# Patient Record
Sex: Male | Born: 1970 | Hispanic: Yes | Marital: Married | Smoking: Never smoker
Health system: Southern US, Community
[De-identification: ages and names within clinical notes are randomized; demographics above are authoritative.]

## PROBLEM LIST (undated history)

## (undated) DIAGNOSIS — E7800 Pure hypercholesterolemia, unspecified: Secondary | ICD-10-CM

## (undated) DIAGNOSIS — K219 Gastro-esophageal reflux disease without esophagitis: Secondary | ICD-10-CM

---

## 2015-05-18 ENCOUNTER — Encounter (HOSPITAL_BASED_OUTPATIENT_CLINIC_OR_DEPARTMENT_OTHER): Payer: Self-pay

## 2015-05-18 ENCOUNTER — Emergency Department (HOSPITAL_BASED_OUTPATIENT_CLINIC_OR_DEPARTMENT_OTHER)
Admission: EM | Admit: 2015-05-18 | Discharge: 2015-05-18 | Disposition: A | Discharge status: Home / Self Care | Payer: BLUE CROSS/BLUE SHIELD | Attending: Emergency Medicine | Admitting: Emergency Medicine

## 2015-05-18 DIAGNOSIS — Z8639 Personal history of other endocrine, nutritional and metabolic disease: Secondary | ICD-10-CM | POA: Diagnosis not present

## 2015-05-18 DIAGNOSIS — Y998 Other external cause status: Secondary | ICD-10-CM | POA: Insufficient documentation

## 2015-05-18 DIAGNOSIS — X58XXXA Exposure to other specified factors, initial encounter: Secondary | ICD-10-CM | POA: Insufficient documentation

## 2015-05-18 DIAGNOSIS — Y9389 Activity, other specified: Secondary | ICD-10-CM | POA: Diagnosis not present

## 2015-05-18 DIAGNOSIS — Y929 Unspecified place or not applicable: Secondary | ICD-10-CM | POA: Insufficient documentation

## 2015-05-18 DIAGNOSIS — S27818A Other injury of esophagus (thoracic part), initial encounter: Secondary | ICD-10-CM

## 2015-05-18 DIAGNOSIS — T17228A Food in pharynx causing other injury, initial encounter: Secondary | ICD-10-CM | POA: Diagnosis present

## 2015-05-18 DIAGNOSIS — J029 Acute pharyngitis, unspecified: Secondary | ICD-10-CM | POA: Insufficient documentation

## 2015-05-18 DIAGNOSIS — K219 Gastro-esophageal reflux disease without esophagitis: Secondary | ICD-10-CM | POA: Diagnosis not present

## 2015-05-18 HISTORY — DX: Pure hypercholesterolemia, unspecified: E78.00

## 2015-05-18 HISTORY — DX: Gastro-esophageal reflux disease without esophagitis: K21.9

## 2015-05-18 MED ORDER — GI COCKTAIL ~~LOC~~
30.0000 mL | Freq: Once | ORAL | Status: AC
Start: 2015-05-18 — End: 2015-05-18
  Administered 2015-05-18: 30 mL via ORAL
  Filled 2015-05-18: qty 30

## 2015-05-18 NOTE — ED Notes (Signed)
Fluid challenge w/o difficulty no vomiting after drinking water

## 2015-05-18 NOTE — ED Notes (Signed)
Pt states feel like fruit stuck in throat since 1pm-NAD

## 2015-05-18 NOTE — ED Provider Notes (Signed)
CSN: [id number]     Arrival date & time 05/18/15  1330 History   First MD Initiated Contact with Patient 05/18/15 1332     Chief Complaint  Patient presents with  . ?FB esophagus      (Consider location/radiation/quality/duration/timing/severity/associated sxs/prior Treatment) HPI Comments: Pt. Is a 44 y/o M who says that he woke up this morning with a sore / dry throat. He says that he had watermelon and cantaloupe. He says that he felt like a piece of watermelon was stuck in his throat. He says that he swallowed plenty of water, and continued to eat his fruit but the sensation of something stuck in his throat persisted. He says he continued to drink fluids, and [facility] he got a cold beverage and drank that as well. He says it was somewhat better after this. He tried to make himself throw up in order to relive what he thought was an obstruction but was unsuccessful. He otherwise has had not bloody sputum, no difficulty swallowing, the pain / discomfort is improving. He has no airways compromise and does not feel short of breath. He has no nausea or vomiting. He says that he will follow up with his PCP.    The history is provided by the patient.    Past Medical History  Diagnosis Date  . GERD (gastroesophageal reflux disease)   . High cholesterol    History reviewed. No pertinent past surgical history. No family history on file. History  Substance Use Topics  . Smoking status: Never Smoker   . Smokeless tobacco: Not on file  . Alcohol Use: No    Review of Systems  Constitutional: Negative.  Negative for fever, chills, activity change, appetite change and fatigue.  HENT: Positive for sore throat. Negative for congestion, drooling, facial swelling, mouth sores, postnasal drip, rhinorrhea, tinnitus, trouble swallowing and voice change.   Eyes: Negative.  Negative for pain and redness.  Respiratory: Negative.  Negative for cough, choking, chest tightness, shortness  of breath and wheezing.   Cardiovascular: Negative.  Negative for chest pain.  Gastrointestinal: Negative for nausea, vomiting, abdominal pain, constipation and abdominal distention.  Endocrine: Negative.   Genitourinary: Negative.   Musculoskeletal: Negative.  Negative for myalgias and neck stiffness.  Skin: Negative.   Allergic/Immunologic: Negative.  Negative for food allergies.  Neurological: Negative.   Hematological: Negative.   Psychiatric/Behavioral: Negative.       Allergies  Review of patient's allergies indicates no known allergies.  Home Medications   Prior to Admission medications   Medication Sig Start Date End Date Taking? Authorizing Provider  OMEPRAZOLE PO Take by mouth.   Yes Historical Provider, MD  UNKNOWN TO PATIENT Cholesterol med   Yes Historical Provider, MD   BP 142/88 mmHg  Pulse 84  Temp(Src) 98.2 F (36.8 C) (Oral)  Resp 16  Ht 5\' 7"  (1.702 m)  Wt 257 lb (116.574 kg)  BMI 40.24 kg/m2  SpO2 98% Physical Exam  Constitutional: He is oriented to person, place, and time. He appears well-developed and well-nourished. No distress.  HENT:  Head: Normocephalic and atraumatic.  Nose: Nose normal.  Mouth/Throat: Uvula is midline, oropharynx is clear and moist and mucous membranes are normal. No uvula swelling. No oropharyngeal exudate, posterior oropharyngeal edema, posterior oropharyngeal erythema or tonsillar abscesses.  Eyes: Conjunctivae and EOM are normal. Pupils are equal, round, and reactive to light.  Neck: Normal range of motion. Neck supple.  Cardiovascular: Normal rate, regular rhythm, normal heart sounds  and intact distal pulses.  Exam reveals no gallop and no friction rub.   No murmur heard. Pulmonary/Chest: Effort normal and breath sounds normal. No stridor. No respiratory distress. He has no wheezes. He has no rales.  Abdominal: Soft. Bowel sounds are normal. He exhibits no distension. There is no tenderness. There is no rebound and no  guarding.  Musculoskeletal: Normal range of motion. He exhibits no edema or tenderness.  Lymphadenopathy:    He has no cervical adenopathy.  Neurological: He is alert and oriented to person, place, and time.  Skin: Skin is warm and dry. He is not diaphoretic.  Psychiatric: He has a normal mood and affect.    ED Course  Procedures (including critical care time) Labs Review Labs Reviewed - No data to display  Imaging Review No results found.   EKG Interpretation None      MDM   Final diagnoses:  None    Pt. Is a 44 y/o M here with discomfort in his throat after a hard swallow of fruit this am. Given GI cocktail to improve discomfort. No evidence of foreign body to exam. He feels as though it is better overall. He will follow up with PCP. Safe for discharge to home.     Yolande Jolly, MD 05/18/15 1441  Tilden Fossa, MD 05/18/15 1505

## 2015-05-18 NOTE — Discharge Instructions (Signed)
Swallowed Foreign Body, Adult °You have swallowed an object (foreign body). Once the foreign body has passed through the food tube (esophagus), which leads from the mouth to the stomach, it will usually continue through the body without problems. This is because the point where the esophagus enters into the stomach is the narrowest place through which the foreign body must pass. °Sometimes the foreign body gets stuck. The most common type of foreign body obstruction in adults is food impaction. Many times, bones from fish or meat products may become lodged in the esophagus or injure the throat on the way down. When there is an object that obstructs the esophagus, the most obvious symptoms are pain and the inability to swallow normally. In some cases, foreign bodies that can be life threatening are swallowed. Examples of these are certain medications and illicit drugs. Often in these instances, patients are afraid of telling what they swallowed. However, it is extremely important to tell the emergency caregiver what was swallowed because life-saving treatment may be needed.  °X-ray exams may be taken to find the location of the foreign body. However, some objects do not show up well or may be too small to be seen on an X-ray image. If the foreign body is too large or too sharp, it may be too dangerous to allow it to pass on its own. You may need to see a caregiver who specializes in the digestive system (gastroenterologist). In a few cases, a specialist may need to remove the object using a method called "endoscopy".  This involves passing a thin, soft, flexible tube into the food pipe to locate and remove the object. Follow up with your primary doctor or the referral you were given by the emergency caregiver. °HOME CARE INSTRUCTIONS  °· If your caregiver says it is safe for you to eat, then only have liquids and soft foods until your symptoms improve. °· Once you are eating normally: °¨ Cut food into small  pieces. °¨ Remove small bones from food. °¨ Remove large seeds and pits from fruit. °¨ Chew your food well. °¨ Do not talk, laugh, or engage in physical activity while eating or swallowing. °SEEK MEDICAL CARE IF: °· You develop worsening shortness of breath, uncontrollable coughing, chest pains or high fever, greater than 102° F (38.9° C). °· You are unable to eat or drink or you feel that food is getting stuck in your throat. °· You have choking symptoms or cannot stop drooling. °· You develop abdominal pain, vomiting (especially of blood), or rectal bleeding. °MAKE SURE YOU:  °· Understand these instructions. °· Will watch your condition. °· Will get help right away if you are not doing well or get worse. °Document Released: 04/11/2010 Document Revised: 01/14/2012 Document Reviewed: 04/11/2010 °ExitCare® Patient Information ©2015 [employer]. This information is not intended to replace advice given to you by your health care provider. Make sure you discuss any questions you have with your health care provider. ° °

## 2015-05-20 ENCOUNTER — Encounter (HOSPITAL_BASED_OUTPATIENT_CLINIC_OR_DEPARTMENT_OTHER): Payer: Self-pay | Admitting: *Deleted

## 2015-05-20 ENCOUNTER — Emergency Department (HOSPITAL_BASED_OUTPATIENT_CLINIC_OR_DEPARTMENT_OTHER)
Admission: EM | Admit: 2015-05-20 | Discharge: 2015-05-20 | Disposition: A | Discharge status: Home / Self Care | Payer: BLUE CROSS/BLUE SHIELD | Attending: Emergency Medicine | Admitting: Emergency Medicine

## 2015-05-20 DIAGNOSIS — R1310 Dysphagia, unspecified: Secondary | ICD-10-CM

## 2015-05-20 DIAGNOSIS — E780 Pure hypercholesterolemia: Secondary | ICD-10-CM | POA: Diagnosis not present

## 2015-05-20 DIAGNOSIS — K219 Gastro-esophageal reflux disease without esophagitis: Secondary | ICD-10-CM | POA: Diagnosis not present

## 2015-05-20 DIAGNOSIS — K222 Esophageal obstruction: Secondary | ICD-10-CM | POA: Diagnosis present

## 2015-05-20 MED ORDER — GI COCKTAIL ~~LOC~~
30.0000 mL | Freq: Once | ORAL | Status: AC
  Administered 2015-05-20: 30 mL via ORAL
  Filled 2015-05-20: qty 30

## 2015-05-20 NOTE — ED Notes (Signed)
Pt denies any current nausea at this time

## 2015-05-20 NOTE — ED Notes (Signed)
MD at bedside. 

## 2015-05-20 NOTE — ED Notes (Signed)
States he was here to days ago with food stuck in his esophagus. This am a pill got stuck. It dissolved over time. He is able to drink water.

## 2015-05-20 NOTE — ED Provider Notes (Signed)
CSN: [id number]     Arrival date & time 05/20/15  1530 History   First MD Initiated Contact with Patient 05/20/15 1624     Chief Complaint  Patient presents with  . Esophageal Stricture      (Consider location/radiation/quality/duration/timing/severity/associated sxs/prior Treatment) HPI Comments: Patient is a 44 year old male who presents for evaluation of esophageal irritation. He states that he had fruit stuck in his throat 2 days ago, however this spontaneously resolved. He was given a GI cocktail here and felt better. Today he attempted to take a naproxen which she believes got stuck in his throat. He was able to drink water and dissolve the pill and is now feeling better. He is concerned of why this is happened twice. He was seen this morning at urgent care and they will plan to set up follow-up with GI. Patient presents here with the impression that more needs to be done. He is able to drink liquids without difficulty.  The history is provided by the patient.    Past Medical History  Diagnosis Date  . GERD (gastroesophageal reflux disease)   . High cholesterol    History reviewed. No pertinent past surgical history. No family history on file. History  Substance Use Topics  . Smoking status: Never Smoker   . Smokeless tobacco: Not on file  . Alcohol Use: No    Review of Systems  All other systems reviewed and are negative.     Allergies  Review of patient's allergies indicates no known allergies.  Home Medications   Prior to Admission medications   Medication Sig Start Date End Date Taking? Authorizing Provider  OMEPRAZOLE PO Take by mouth.    Historical Provider, MD  UNKNOWN TO PATIENT Cholesterol med    Historical Provider, MD   BP 152/92 mmHg  Pulse 64  Temp(Src) 98.2 F (36.8 C) (Oral)  Resp 20  Ht 5\' 7"  (1.702 m)  Wt 257 lb (116.574 kg)  BMI 40.24 kg/m2  SpO2 97% Physical Exam  Constitutional: He is oriented to person, place, and time. He appears  well-developed and well-nourished. No distress.  HENT:  Head: Normocephalic and atraumatic.  Mouth/Throat: Oropharynx is clear and moist.  Neck: Normal range of motion. Neck supple.  Cardiovascular: Normal rate, regular rhythm and normal heart sounds.   No murmur heard. Pulmonary/Chest: Effort normal and breath sounds normal. No respiratory distress. He has no wheezes.  Abdominal: Soft. Bowel sounds are normal. He exhibits no distension. There is no tenderness.  Musculoskeletal: Normal range of motion. He exhibits no edema.  Lymphadenopathy:    He has no cervical adenopathy.  Neurological: He is alert and oriented to person, place, and time.  Skin: Skin is warm and dry. He is not diaphoretic.  Nursing note and vitals reviewed.   ED Course  Procedures (including critical care time) Labs Review Labs Reviewed - No data to display  Imaging Review No results found.   EKG Interpretation None      MDM   Final diagnoses:  None    Patient is able to tolerate liquids without difficulty. He is requesting a GI cocktail because this gave him relief 2 days ago. This will be given and he will be given the follow-up information for gastroenterology with whom he can schedule an outpatient follow-up to discuss an endoscopy.    Geoffery Lyonsouglas Alicja Everitt, MD 05/20/15 [id number]

## 2015-05-20 NOTE — ED Notes (Signed)
Pt presents to ED, returned after being here approx 2 days ago, recd GI cocktail, returns today with having difficulty swallowing. Has been tolerating crackers and soup. This am attempted to take PO med and medication "got stuck" per pt statement.

## 2015-05-20 NOTE — Discharge Instructions (Signed)
Call the [facility] office to arrange a follow-up appointment. The contact information has been provided in this discharge summary. Call on Monday to arrange this appointment.  Return to the emergency department if you develop difficulty breathing, and inability to swallow, or other new and concerning symptoms.   Dysphagia Swallowing problems (dysphagia) occur when solids and liquids seem to stick in your throat on the way down to your stomach, or the food takes longer to get to the stomach. Other symptoms include regurgitating food, noises coming from the throat, chest discomfort with swallowing, and a feeling of fullness or the feeling of something being stuck in your throat when swallowing. When blockage in your throat is complete, it may be associated with drooling. CAUSES  Problems with swallowing may occur because of problems with the muscles. The food cannot be propelled in the usual manner into your stomach. You may have ulcers, scar tissue, or inflammation in the tube down which food travels from your mouth to your stomach (esophagus), which blocks food from passing normally into the stomach. Causes of inflammation include:  Acid reflux from your stomach into your esophagus.  Infection.  Radiation treatment for cancer.  Medicines taken without enough fluids to wash them down into your stomach. You may have nerve problems that prevent signals from being sent to the muscles of your esophagus to contract and move your food down to your stomach. Globus pharyngeus is a relatively common problem in which there is a sense of an obstruction or difficulty in swallowing, without any physical abnormalities of the swallowing passages being found. This problem usually improves over time with reassurance and testing to rule out other causes. DIAGNOSIS Dysphagia can be diagnosed and its cause can be determined by tests in which you swallow a white substance that helps illuminate the inside  of your throat (contrast medium) while X-rays are taken. Sometimes a flexible telescope that is inserted down your throat (endoscopy) to look at your esophagus and stomach is used. TREATMENT   If the dysphagia is caused by acid reflux or infection, medicines may be used.  If the dysphagia is caused by problems with your swallowing muscles, swallowing therapy may be used to help you strengthen your swallowing muscles.  If the dysphagia is caused by a blockage or mass, procedures to remove the blockage may be done. HOME CARE INSTRUCTIONS  Try to eat soft food that is easier to swallow and check your weight on a daily basis to be sure that it is not decreasing.  Be sure to drink liquids when sitting upright (not lying down). SEEK MEDICAL CARE IF:  You are losing weight because you are unable to swallow.  You are coughing when you drink liquids (aspiration).  You are coughing up partially digested food. SEEK IMMEDIATE MEDICAL CARE IF:  You are unable to swallow your own saliva .  You are having shortness of breath or a fever, or both.  You have a hoarse voice along with difficulty swallowing. MAKE SURE YOU:  Understand these instructions.  Will watch your condition.  Will get help right away if you are not doing well or get worse. Document Released: 10/19/2000 Document Revised: 03/08/2014 Document Reviewed: 04/10/2013 [facility] Patient Information 2015 [place], [place]. This information is not intended to replace advice given to you by your health care provider. Make sure you discuss any questions you have with your health care provider.

## 2015-05-24 ENCOUNTER — Emergency Department (HOSPITAL_COMMUNITY): Payer: BLUE CROSS/BLUE SHIELD

## 2015-05-24 ENCOUNTER — Emergency Department (HOSPITAL_COMMUNITY)
Admission: EM | Admit: 2015-05-24 | Discharge: 2015-05-24 | Disposition: A | Discharge status: Home / Self Care | Payer: BLUE CROSS/BLUE SHIELD | Attending: Emergency Medicine | Admitting: Emergency Medicine

## 2015-05-24 ENCOUNTER — Encounter (HOSPITAL_COMMUNITY): Payer: Self-pay | Admitting: *Deleted

## 2015-05-24 DIAGNOSIS — F458 Other somatoform disorders: Secondary | ICD-10-CM | POA: Diagnosis not present

## 2015-05-24 DIAGNOSIS — E780 Pure hypercholesterolemia: Secondary | ICD-10-CM | POA: Insufficient documentation

## 2015-05-24 DIAGNOSIS — K219 Gastro-esophageal reflux disease without esophagitis: Secondary | ICD-10-CM | POA: Insufficient documentation

## 2015-05-24 DIAGNOSIS — R0989 Other specified symptoms and signs involving the circulatory and respiratory systems: Secondary | ICD-10-CM

## 2015-05-24 DIAGNOSIS — M549 Dorsalgia, unspecified: Secondary | ICD-10-CM | POA: Diagnosis not present

## 2015-05-24 DIAGNOSIS — F419 Anxiety disorder, unspecified: Secondary | ICD-10-CM | POA: Diagnosis not present

## 2015-05-24 DIAGNOSIS — R0981 Nasal congestion: Secondary | ICD-10-CM | POA: Insufficient documentation

## 2015-05-24 DIAGNOSIS — Z79899 Other long term (current) drug therapy: Secondary | ICD-10-CM | POA: Diagnosis not present

## 2015-05-24 DIAGNOSIS — R0602 Shortness of breath: Secondary | ICD-10-CM | POA: Diagnosis present

## 2015-05-24 MED ORDER — SALINE SPRAY 0.65 % NA SOLN
1.0000 | Freq: Once | NASAL | Status: AC
  Administered 2015-05-24: 1 via NASAL
  Filled 2015-05-24: qty 44

## 2015-05-24 MED ORDER — LORAZEPAM 1 MG PO TABS: 1.0000 mg | ORAL_TABLET | Freq: Three times a day (TID) | ORAL | Status: DC | PRN

## 2015-05-24 NOTE — ED Notes (Signed)
Pt given ice water per Dr. Norlene Campbelltter

## 2015-05-24 NOTE — ED Provider Notes (Signed)
CSN: [id number]     Arrival date & time 05/24/15  0027 History  This chart was scribed for Marisa Severin, MD by Placido Sou, ED scribe. This patient was seen in room WA13/WA13 and the patient's care was started at 2:40 AM.  Chief Complaint  Patient presents with  . Shortness of Breath   The history is provided by the patient. No language interpreter was used.    HPI Comments: Casey Sims is a 44 y.o. male, with a history of GERD, who presents to the Emergency Department complaining of acute SOB with onset at 12:30 pm. He notes associated trouble swallowing, describes the feeling as "dry", and further notes using his albuterol inhaler with no relief of his symptoms. Pt describes the SOB as a "blockage" although he confirms never experiencing an inability to breathe. Pt notes a history of trouble swallowing with one occurrence involving fruit and another involving a pill. He notes receiving a referral for a gastroenterologist after a prior visit and is working to make an appointment with them.    Pt also notes left-sided back pain with onset 2 weeks ago. He notes seeing a previous provider at urgent care for this issue and received an x-ray with negative results as well as an anti-inflammatory Rx.   Past Medical History  Diagnosis Date  . GERD (gastroesophageal reflux disease)   . High cholesterol    History reviewed. No pertinent past surgical history. No family history on file. History  Substance Use Topics  . Smoking status: Never Smoker   . Smokeless tobacco: Not on file  . Alcohol Use: No    Review of Systems  HENT: Positive for congestion, trouble swallowing and voice change.   Respiratory: Positive for shortness of breath.   Musculoskeletal: Positive for back pain.  All other systems reviewed and are negative.   Allergies  Review of patient's allergies indicates no known allergies.  Home Medications   Prior to Admission medications   Medication Sig Start Date End Date  Taking? Authorizing Provider  albuterol (PROVENTIL HFA;VENTOLIN HFA) 108 (90 BASE) MCG/ACT inhaler Inhale 1 puff into the lungs every 6 (six) hours as needed for wheezing or shortness of breath.   Yes Historical Provider, MD  atorvastatin (LIPITOR) 20 MG tablet Take 20 mg by mouth daily.   Yes Historical Provider, MD  baclofen (LIORESAL) 10 MG tablet Take 10 mg by mouth 3 (three) times daily.   Yes Historical Provider, MD  omeprazole (PRILOSEC) 20 MG capsule Take 20 mg by mouth daily.   Yes Historical Provider, MD  OMEPRAZOLE PO Take by mouth.    Historical Provider, MD  UNKNOWN TO PATIENT Cholesterol med    Historical Provider, MD   BP 158/100 mmHg  Pulse 86  Temp(Src) 98.8 F (37.1 C) (Oral)  Resp 17  SpO2 100% Physical Exam  Constitutional: He is oriented to person, place, and time. He appears well-developed and well-nourished.  HENT:  Head: Normocephalic and atraumatic.  Nose: Nose normal.  Mouth/Throat: Oropharynx is clear and moist.  Eyes: Conjunctivae and EOM are normal. Pupils are equal, round, and reactive to light.  Neck: Normal range of motion. Neck supple. No JVD present. No tracheal deviation present. No thyromegaly present.  No stridor, no masses, swallowing and breathing easily  Cardiovascular: Normal rate, regular rhythm, normal heart sounds and intact distal pulses.  Exam reveals no gallop and no friction rub.   No murmur heard. Pulmonary/Chest: Effort normal and breath sounds normal. No stridor. No respiratory distress.  He has no wheezes. He has no rales. He exhibits no tenderness.  Abdominal: Soft. Bowel sounds are normal. He exhibits no distension and no mass. There is no tenderness. There is no rebound and no guarding.  Musculoskeletal: Normal range of motion. He exhibits tenderness (tenderness to palpation just medial to left scapula without overlying skin changes, crepitus or masses). He exhibits no edema.  Lymphadenopathy:    He has no cervical adenopathy.   Neurological: He is alert and oriented to person, place, and time. He displays normal reflexes. He exhibits normal muscle tone. Coordination normal.  Skin: Skin is warm and dry. No rash noted. No erythema. No pallor.  Psychiatric: His behavior is normal. Judgment and thought content normal.  Anxious appearing  Nursing note and vitals reviewed.   ED Course  Procedures  DIAGNOSTIC STUDIES: Oxygen Saturation is 100% on RA, normal by my interpretation.    COORDINATION OF CARE: 2:49 AM Discussed treatment plan with pt at bedside and pt agreed to plan.  Labs Review Labs Reviewed - No data to display  Imaging Review Dg Neck Soft Tissue  05/24/2015   CLINICAL DATA:  Shortness of breath, throat tightness, difficulty swallowing.  EXAM: NECK SOFT TISSUES - 1+ VIEW  COMPARISON:  None.  FINDINGS: There is no evidence of retropharyngeal soft tissue swelling or epiglottic enlargement. Punctate probable tonsillolith. The cervical airway is unremarkable and no radio-opaque foreign body identified.  Fullness of the submandibular/floor of mouth soft tissues.  IMPRESSION: Fullness of the submandibular/floor of mouth soft tissues could reflect edema or habitus.  Patent airway.   Electronically Signed   By: Awilda Metroourtnay  Bloomer M.D.   On: 05/24/2015 03:30   Dg Chest 2 View  05/24/2015   CLINICAL DATA:  Acute onset of shortness of breath. Throat tightness and difficulty swallowing. Initial encounter.  EXAM: CHEST  2 VIEW  COMPARISON:  None.  FINDINGS: The lungs are well-aerated and clear. There is no evidence of focal opacification, pleural effusion or pneumothorax.  The heart is normal in size; the mediastinal contour is within normal limits. No acute osseous abnormalities are seen.  IMPRESSION: No acute cardiopulmonary process seen.   Electronically Signed   By: Roanna RaiderJeffery  Chang M.D.   On: 05/24/2015 02:03     EKG Interpretation   Date/Time:  Tuesday May 24 2015 00:49:39 EDT Ventricular Rate:  81 PR  Interval:  176 QRS Duration: 104 QT Interval:  367 QTC Calculation: 426 R Axis:   52 Text Interpretation:  Sinus rhythm Confirmed by Kyree Fedorko  MD, Casey Sims ([id number]) on  05/24/2015 3:34:08 AM      MDM   Final diagnoses:  Globus sensation    44 year old male with a sensation of his esophagus/throat closing while at work.  Patient has had ongoing issues with left upper back pain, and dysphagia.  He is awaiting follow-up with GI.  Patient reports that his mouth is currently dry and he has to continually swallow to get past the lump in his throat.  Workup otherwise unremarkable, suspect globus sensation.  Patient isn't encouraged to follow back up with his primary care doctor as planned with referral to GI.  Will prescribe short course of Ativan to help with anxiety.   I personally performed the services described in this documentation, which was scribed in my presence. The recorded information has been reviewed and is accurate.     Marisa Severinlga Jamonta Goerner, MD 05/24/15 [phone]

## 2015-05-24 NOTE — Discharge Instructions (Signed)
Continue current medications.  Take ativan as needed for anxiety.   Globus Syndrome Globus Syndrome is a feeling of a lump or a sensation of something caught in your throat. Eating food or drinking fluids does not seem to get rid of it. Yet it is not noticeable during the actual act of swallowing food or liquids. Usually there is nothing physically wrong. It is troublesome because it is an unpleasant sensation which is sometimes difficult to ignore and at times may seem to worsen. The syndrome is quite common. It is estimated 45% of the population experiences features of the condition at some stage during their lives. The symptoms are usually temporary. The largest group of people who feel the need to seek medical treatment is females between the ages of 28 to 45.  CAUSES  Globus Syndrome appears to be triggered by or aggravated by stress, anxiety and depression.  Tension related to stress could product abnormal muscle spasms in the esophagus which would account for the sensation of a lump or ball in your throat.  Frequent swallowing or drying of the throat caused by anxiety or other strong emotions can also produce this uncomfortable sensation in your throat.  Fear and sadness can be expressed by the body in many ways. For instance, if you had a relative with throat cancer you might become overly concerned about your own health and develop uncomfortable sensations in your throat.  The reaction to a crisis or a trauma event in your life can take the form of a lump in your throat. It is as if you are indirectly saying you can not handle or "swallow" one more thing. DIAGNOSIS  Usually your caregiver will know what is wrong by talking to you and examining you. If the condition persists for several days, more testing may be done to make sure there is not another problem present. This is usually not the case. TREATMENT   Reassurance is often the best treatment available. Usually the problem leaves  without treatment over several days.  Sometimes anti-anxiety medications may be prescribed.  Counseling or talk therapy can also help with strong underlying emotions.  Note that in most cases this is not something that keeps coming back and you should not be concerned or worried. Document Released: 01/12/2004 Document Revised: 01/14/2012 Document Reviewed: 06/10/2008 [facility] Patient Information 2015 [place], [place]. This information is not intended to replace advice given to you by your health care provider. Make sure you discuss any questions you have with your health care provider.   Dysphagia Swallowing problems (dysphagia) occur when solids and liquids seem to stick in your throat on the way down to your stomach, or the food takes longer to get to the stomach. Other symptoms include regurgitating food, noises coming from the throat, chest discomfort with swallowing, and a feeling of fullness or the feeling of something being stuck in your throat when swallowing. When blockage in your throat is complete, it may be associated with drooling. CAUSES  Problems with swallowing may occur because of problems with the muscles. The food cannot be propelled in the usual manner into your stomach. You may have ulcers, scar tissue, or inflammation in the tube down which food travels from your mouth to your stomach (esophagus), which blocks food from passing normally into the stomach. Causes of inflammation include:  Acid reflux from your stomach into your esophagus.  Infection.  Radiation treatment for cancer.  Medicines taken without enough fluids to wash them down into your stomach. You  may have nerve problems that prevent signals from being sent to the muscles of your esophagus to contract and move your food down to your stomach. Globus pharyngeus is a relatively common problem in which there is a sense of an obstruction or difficulty in swallowing, without any physical abnormalities of the  swallowing passages being found. This problem usually improves over time with reassurance and testing to rule out other causes. DIAGNOSIS Dysphagia can be diagnosed and its cause can be determined by tests in which you swallow a white substance that helps illuminate the inside of your throat (contrast medium) while X-rays are taken. Sometimes a flexible telescope that is inserted down your throat (endoscopy) to look at your esophagus and stomach is used. TREATMENT   If the dysphagia is caused by acid reflux or infection, medicines may be used.  If the dysphagia is caused by problems with your swallowing muscles, swallowing therapy may be used to help you strengthen your swallowing muscles.  If the dysphagia is caused by a blockage or mass, procedures to remove the blockage may be done. HOME CARE INSTRUCTIONS  Try to eat soft food that is easier to swallow and check your weight on a daily basis to be sure that it is not decreasing.  Be sure to drink liquids when sitting upright (not lying down). SEEK MEDICAL CARE IF:  You are losing weight because you are unable to swallow.  You are coughing when you drink liquids (aspiration).  You are coughing up partially digested food. SEEK IMMEDIATE MEDICAL CARE IF:  You are unable to swallow your own saliva .  You are having shortness of breath or a fever, or both.  You have a hoarse voice along with difficulty swallowing. MAKE SURE YOU:  Understand these instructions.  Will watch your condition.  Will get help right away if you are not doing well or get worse. Document Released: 10/19/2000 Document Revised: 03/08/2014 Document Reviewed: 04/10/2013 [facility] Patient Information 2015 [place]. This information is not intended to replace advice given to you by your health care provider. Make sure you discuss any questions you have with your health care provider.

## 2015-05-24 NOTE — ED Notes (Addendum)
Pt reports SOB while he was at work [employer]. Pt also c/o back pain and dysphagia.  Pt reports he was [facility] SO[facility]

## 2015-05-25 ENCOUNTER — Emergency Department (HOSPITAL_BASED_OUTPATIENT_CLINIC_OR_DEPARTMENT_OTHER)
Admission: EM | Admit: 2015-05-25 | Discharge: 2015-05-25 | Disposition: A | Discharge status: Home / Self Care | Payer: BLUE CROSS/BLUE SHIELD | Attending: Emergency Medicine | Admitting: Emergency Medicine

## 2015-05-25 ENCOUNTER — Encounter (HOSPITAL_BASED_OUTPATIENT_CLINIC_OR_DEPARTMENT_OTHER): Payer: Self-pay | Admitting: *Deleted

## 2015-05-25 ENCOUNTER — Ambulatory Visit: Payer: Self-pay | Admitting: Medical

## 2015-05-25 ENCOUNTER — Emergency Department (HOSPITAL_BASED_OUTPATIENT_CLINIC_OR_DEPARTMENT_OTHER): Payer: BLUE CROSS/BLUE SHIELD

## 2015-05-25 DIAGNOSIS — Z8619 Personal history of other infectious and parasitic diseases: Secondary | ICD-10-CM | POA: Diagnosis not present

## 2015-05-25 DIAGNOSIS — R1310 Dysphagia, unspecified: Secondary | ICD-10-CM | POA: Diagnosis not present

## 2015-05-25 DIAGNOSIS — E780 Pure hypercholesterolemia: Secondary | ICD-10-CM | POA: Insufficient documentation

## 2015-05-25 DIAGNOSIS — Z79899 Other long term (current) drug therapy: Secondary | ICD-10-CM | POA: Insufficient documentation

## 2015-05-25 DIAGNOSIS — R070 Pain in throat: Secondary | ICD-10-CM | POA: Diagnosis not present

## 2015-05-25 DIAGNOSIS — K219 Gastro-esophageal reflux disease without esophagitis: Secondary | ICD-10-CM | POA: Insufficient documentation

## 2015-05-25 DIAGNOSIS — J029 Acute pharyngitis, unspecified: Secondary | ICD-10-CM | POA: Diagnosis present

## 2015-05-25 DIAGNOSIS — R52 Pain, unspecified: Secondary | ICD-10-CM

## 2015-05-25 LAB — RAPID STREP SCREEN (MED CTR MEBANE ONLY): Streptococcus, Group A Screen (Direct): NEGATIVE

## 2015-05-25 MED ORDER — IOHEXOL 300 MG/ML  SOLN
75.0000 mL | Freq: Once | INTRAMUSCULAR | Status: AC | PRN
  Administered 2015-05-25: 75 mL via INTRAVENOUS

## 2015-05-25 MED ORDER — GI COCKTAIL ~~LOC~~
30.0000 mL | Freq: Once | ORAL | Status: AC
  Administered 2015-05-25: 30 mL via ORAL
  Filled 2015-05-25: qty 30

## 2015-05-25 NOTE — ED Provider Notes (Addendum)
CSN: [id number]     Arrival date & time 05/25/15  1946 History   First MD Initiated Contact with Patient 05/25/15 2013     Chief Complaint  Patient presents with  . Sore Throat     (Consider location/radiation/quality/duration/timing/severity/associated sxs/prior Treatment) HPI comPlains of pain with swallowing and feeling that there is a mass in his throat, left side onset 5 days ago. He denies difficulty breathing no felt difficulty breathing on her 05/20/15. He no longer feels as anything is stuck in his throat. He has pain with swallowing his saliva. He is afraid to eat solid food he has been drinking, without difficulty. He's been prescribed Ativan which she's taken without relief and has an appointment with 8 gastroenterologist tomorrow. Past Medical History  Diagnosis Date  . GERD (gastroesophageal reflux disease)   . High cholesterol    H pylori History reviewed. No pertinent past surgical history. History reviewed. No pertinent family history. History  Substance Use Topics  . Smoking status: Never Smoker   . Smokeless tobacco: Not on file  . Alcohol Use: No   no illicit drug use  Review of Systems  Constitutional: Negative.   HENT: Positive for sore throat and trouble swallowing. Negative for voice change.   Respiratory: Negative.   Cardiovascular: Negative.   Gastrointestinal: Negative.   Musculoskeletal: Negative.   Skin: Negative.   Neurological: Negative.   Psychiatric/Behavioral: Negative.   All other systems reviewed and are negative.     Allergies  Review of patient's allergies indicates no known allergies.  Home Medications   Prior to Admission medications   Medication Sig Start Date End Date Taking? Authorizing Provider  albuterol (PROVENTIL HFA;VENTOLIN HFA) 108 (90 BASE) MCG/ACT inhaler Inhale 1 puff into the lungs every 6 (six) hours as needed for wheezing or shortness of breath.    Historical Provider, MD  atorvastatin (LIPITOR) 20 MG tablet Take  20 mg by mouth daily.    Historical Provider, MD  baclofen (LIORESAL) 10 MG tablet Take 10 mg by mouth 3 (three) times daily.    Historical Provider, MD  LORazepam (ATIVAN) 1 MG tablet Take 1 tablet (1 mg total) by mouth 3 (three) times daily as needed for anxiety. 05/24/15   Marisa Severinlga Otter, MD  omeprazole (PRILOSEC) 20 MG capsule Take 20 mg by mouth daily.    Historical Provider, MD  OMEPRAZOLE PO Take by mouth.    Historical Provider, MD  UNKNOWN TO PATIENT Cholesterol med    Historical Provider, MD   BP 131/91 mmHg  Pulse 73  Temp(Src) 99.1 F (37.3 C)  Resp 16  Ht 5\' 7"  (1.702 m)  Wt 245 lb (111.131 kg)  BMI 38.36 kg/m2  SpO2 100% Physical Exam  Constitutional: He appears well-developed and well-nourished. No distress.  HENT:  Head: Normocephalic and atraumatic.  Mouth/Throat: Oropharynx is clear and moist.  No trismus. Submandibular area nontender or firm  Eyes: Conjunctivae are normal. Pupils are equal, round, and reactive to light.  Neck: Neck supple. No tracheal deviation present. No thyromegaly present.  Cardiovascular: Normal rate and regular rhythm.   No murmur heard. Pulmonary/Chest: Effort normal and breath sounds normal.  Abdominal: Soft. Bowel sounds are normal. He exhibits no distension. There is no tenderness.  Musculoskeletal: Normal range of motion. He exhibits no edema or tenderness.  Lymphadenopathy:    He has no cervical adenopathy.  Neurological: He is alert. Coordination normal.  Skin: Skin is warm and dry. No rash noted.  Psychiatric: He has a normal  mood and affect.  Nursing note and vitals reviewed.   ED Course  Procedures (including critical care time) Labs Review Labs Reviewed  RAPID STREP SCREEN (NOT AT [facility])  CULTURE, GROUP A STREP    Imaging Review Dg Neck Soft Tissue  05/24/2015   CLINICAL DATA:  Shortness of breath, throat tightness, difficulty swallowing.  EXAM: NECK SOFT TISSUES - 1+ VIEW  COMPARISON:  None.  FINDINGS: There is no evidence  of retropharyngeal soft tissue swelling or epiglottic enlargement. Punctate probable tonsillolith. The cervical airway is unremarkable and no radio-opaque foreign body identified.  Fullness of the submandibular/floor of mouth soft tissues.  IMPRESSION: Fullness of the submandibular/floor of mouth soft tissues could reflect edema or habitus.  Patent airway.   Electronically Signed   By: Awilda Metro M.D.   On: 05/24/2015 03:30   Dg Chest 2 View  05/24/2015   CLINICAL DATA:  Acute onset of shortness of breath. Throat tightness and difficulty swallowing. Initial encounter.  EXAM: CHEST  2 VIEW  COMPARISON:  None.  FINDINGS: The lungs are well-aerated and clear. There is no evidence of focal opacification, pleural effusion or pneumothorax.  The heart is normal in size; the mediastinal contour is within normal limits. No acute osseous abnormalities are seen.  IMPRESSION: No acute cardiopulmonary process seen.   Electronically Signed   By: Roanna Raider M.D.   On: 05/24/2015 02:03     EKG Interpretation None     11:10 PM discomfort improved after treatment with GI cocktail Results for orders placed or performed during the hospital encounter of 05/25/15  Rapid strep screen  Result Value Ref Range   Streptococcus, Group A Screen (Direct) NEGATIVE NEGATIVE   Dg Neck Soft Tissue  05/24/2015   CLINICAL DATA:  Shortness of breath, throat tightness, difficulty swallowing.  EXAM: NECK SOFT TISSUES - 1+ VIEW  COMPARISON:  None.  FINDINGS: There is no evidence of retropharyngeal soft tissue swelling or epiglottic enlargement. Punctate probable tonsillolith. The cervical airway is unremarkable and no radio-opaque foreign body identified.  Fullness of the submandibular/floor of mouth soft tissues.  IMPRESSION: Fullness of the submandibular/floor of mouth soft tissues could reflect edema or habitus.  Patent airway.   Electronically Signed   By: Awilda Metro M.D.   On: 05/24/2015 03:30   Dg Chest 2  View  05/24/2015   CLINICAL DATA:  Acute onset of shortness of breath. Throat tightness and difficulty swallowing. Initial encounter.  EXAM: CHEST  2 VIEW  COMPARISON:  None.  FINDINGS: The lungs are well-aerated and clear. There is no evidence of focal opacification, pleural effusion or pneumothorax.  The heart is normal in size; the mediastinal contour is within normal limits. No acute osseous abnormalities are seen.  IMPRESSION: No acute cardiopulmonary process seen.   Electronically Signed   By: Roanna Raider M.D.   On: 05/24/2015 02:03   Ct Soft Tissue Neck W Contrast  05/25/2015   CLINICAL DATA:  Initial evaluation for are acute left-sided neck pain, difficulty swelling, difficulty breathing.  EXAM: CT NECK WITH CONTRAST  TECHNIQUE: Multidetector CT imaging of the neck was performed using the standard protocol following the bolus administration of intravenous contrast.  CONTRAST:  75mL OMNIPAQUE IOHEXOL 300 MG/ML  SOLN  COMPARISON:  Prior radiograph from earlier the same day.  FINDINGS: Visualized portions of the brain are unremarkable. Partially visualized globes within normal limits.  Paranasal sinuses and mastoid air cells are clear.  Salivary glands including the parotid glands and submandibular glands  are normal in appearance.  Evaluation of the oral cavity limited by motion artifact. No mass lesion or loculated fluid collection identified. Palatine tonsils are grossly symmetric. Parapharyngeal fat is well preserved bilaterally. Oropharynx and nasopharynx within normal limits. No retropharyngeal fluid collection. Epiglottis is normal. Vallecula is clear. Lingual tonsils normal. Hypopharynx and supraglottic larynx demonstrate no acute abnormality. True vocal cords are symmetric and normal in appearance bilaterally. Subglottic airway is clear. No radiopaque foreign body identified.  No pathologically enlarged lymph nodes identified within the neck. Thyroid gland is normal. Visualized superior  mediastinum within normal limits.  Visualized lung apices are clear without acute process.  Normal intravascular enhancement seen within the neck.  No acute osseous abnormality. No worrisome lytic or blastic osseous lesions.  IMPRESSION: No acute abnormality identified within the neck. No radiopaque foreign body identified. No adenopathy or acute inflammatory changes identified.   Electronically Signed   By: Rise Mu M.D.   On: 05/25/2015 21:13    MDM  Plan patient is encouraged to keep his appointment with gastroenterologist tomorrow Diagnosis dysphagia Final diagnoses:  Pain        Doug Sou, MD 05/25/15 2314  Doug Sou, MD 05/25/15 2314

## 2015-05-25 NOTE — ED Notes (Signed)
Patient transported to X-ray 

## 2015-05-25 NOTE — ED Notes (Signed)
Pt c/o sore throat and " feeling like something is stuck in there" seen here 3 times this month for same , pt states no relief

## 2015-05-25 NOTE — Discharge Instructions (Signed)
Keep your scheduled appointment with your gastroenterologist tomorrow. Tell him that you had a CT scan of your neck performed here which was normal.

## 2015-05-27 ENCOUNTER — Emergency Department (HOSPITAL_BASED_OUTPATIENT_CLINIC_OR_DEPARTMENT_OTHER)
Admission: EM | Admit: 2015-05-27 | Discharge: 2015-05-27 | Disposition: A | Discharge status: Home / Self Care | Payer: BLUE CROSS/BLUE SHIELD | Attending: Emergency Medicine | Admitting: Emergency Medicine

## 2015-05-27 ENCOUNTER — Encounter (HOSPITAL_BASED_OUTPATIENT_CLINIC_OR_DEPARTMENT_OTHER): Payer: Self-pay

## 2015-05-27 DIAGNOSIS — E780 Pure hypercholesterolemia: Secondary | ICD-10-CM | POA: Diagnosis not present

## 2015-05-27 DIAGNOSIS — Z79899 Other long term (current) drug therapy: Secondary | ICD-10-CM | POA: Insufficient documentation

## 2015-05-27 DIAGNOSIS — K219 Gastro-esophageal reflux disease without esophagitis: Secondary | ICD-10-CM | POA: Diagnosis not present

## 2015-05-27 DIAGNOSIS — R1310 Dysphagia, unspecified: Secondary | ICD-10-CM | POA: Diagnosis present

## 2015-05-27 MED ORDER — AZITHROMYCIN 200 MG/5ML PO SUSR: 250.0000 mg | Freq: Every day | ORAL | Status: DC

## 2015-05-27 NOTE — Discharge Instructions (Signed)
Stop taking your Biaxin.  Begin taking Zithromax.   Dysphagia Swallowing problems (dysphagia) occur when solids and liquids seem to stick in your throat on the way down to your stomach, or the food takes longer to get to the stomach. Other symptoms include regurgitating food, noises coming from the throat, chest discomfort with swallowing, and a feeling of fullness or the feeling of something being stuck in your throat when swallowing. When blockage in your throat is complete, it may be associated with drooling. CAUSES  Problems with swallowing may occur because of problems with the muscles. The food cannot be propelled in the usual manner into your stomach. You may have ulcers, scar tissue, or inflammation in the tube down which food travels from your mouth to your stomach (esophagus), which blocks food from passing normally into the stomach. Causes of inflammation include:  Acid reflux from your stomach into your esophagus.  Infection.  Radiation treatment for cancer.  Medicines taken without enough fluids to wash them down into your stomach. You may have nerve problems that prevent signals from being sent to the muscles of your esophagus to contract and move your food down to your stomach. Globus pharyngeus is a relatively common problem in which there is a sense of an obstruction or difficulty in swallowing, without any physical abnormalities of the swallowing passages being found. This problem usually improves over time with reassurance and testing to rule out other causes. DIAGNOSIS Dysphagia can be diagnosed and its cause can be determined by tests in which you swallow a white substance that helps illuminate the inside of your throat (contrast medium) while X-rays are taken. Sometimes a flexible telescope that is inserted down your throat (endoscopy) to look at your esophagus and stomach is used. TREATMENT   If the dysphagia is caused by acid reflux or infection, medicines may be  used.  If the dysphagia is caused by problems with your swallowing muscles, swallowing therapy may be used to help you strengthen your swallowing muscles.  If the dysphagia is caused by a blockage or mass, procedures to remove the blockage may be done. HOME CARE INSTRUCTIONS  Try to eat soft food that is easier to swallow and check your weight on a daily basis to be sure that it is not decreasing.  Be sure to drink liquids when sitting upright (not lying down). SEEK MEDICAL CARE IF:  You are losing weight because you are unable to swallow.  You are coughing when you drink liquids (aspiration).  You are coughing up partially digested food. SEEK IMMEDIATE MEDICAL CARE IF:  You are unable to swallow your own saliva .  You are having shortness of breath or a fever, or both.  You have a hoarse voice along with difficulty swallowing. MAKE SURE YOU:  Understand these instructions.  Will watch your condition.  Will get help right away if you are not doing well or get worse. Document Released: 10/19/2000 Document Revised: 03/08/2014 Document Reviewed: 04/10/2013 [facility] Patient Information 2015 [place], [place]. This information is not intended to replace advice given to you by your health care provider. Make sure you discuss any questions you have with your health care provider.

## 2015-05-27 NOTE — ED Provider Notes (Signed)
CSN: [id number]     Arrival date & time 05/27/15  1553 History   First MD Initiated Contact with Patient 05/27/15 1619     Chief Complaint  Patient presents with  . Swallow issues      (Consider location/radiation/quality/duration/timing/severity/associated sxs/prior Treatment) HPI Comments: Patient is a 44 year old male with history of gastroesophageal reflux and suspected H. pylori. He is currently being treated with Biaxin and amoxicillin. He presents today stating that the Biaxin has left a bad taste in his mouth and is requesting a different antibiotics. He denies to me he is having any difficulty swallowing but states that he cannot take pills. He denies any fevers or chills. He denies any difficulty breathing.  The history is provided by the patient.    Past Medical History  Diagnosis Date  . GERD (gastroesophageal reflux disease)   . High cholesterol    History reviewed. No pertinent past surgical history. No family history on file. History  Substance Use Topics  . Smoking status: Never Smoker   . Smokeless tobacco: Not on file  . Alcohol Use: No    Review of Systems  All other systems reviewed and are negative.     Allergies  Review of patient's allergies indicates no known allergies.  Home Medications   Prior to Admission medications   Medication Sig Start Date End Date Taking? Authorizing Provider  albuterol (PROVENTIL HFA;VENTOLIN HFA) 108 (90 BASE) MCG/ACT inhaler Inhale 1 puff into the lungs every 6 (six) hours as needed for wheezing or shortness of breath.    Historical Provider, MD  atorvastatin (LIPITOR) 20 MG tablet Take 20 mg by mouth daily.    Historical Provider, MD  baclofen (LIORESAL) 10 MG tablet Take 10 mg by mouth 3 (three) times daily.    Historical Provider, MD  LORazepam (ATIVAN) 1 MG tablet Take 1 tablet (1 mg total) by mouth 3 (three) times daily as needed for anxiety. 05/24/15   Marisa Severin, MD  omeprazole (PRILOSEC) 20 MG capsule Take 20  mg by mouth daily.    Historical Provider, MD  OMEPRAZOLE PO Take by mouth.    Historical Provider, MD  UNKNOWN TO PATIENT Cholesterol med    Historical Provider, MD   BP 129/80 mmHg  Pulse 92  Temp(Src) 98.7 F (37.1 C) (Oral)  Resp 18  Ht 5\' 7"  (1.702 m)  Wt 245 lb (111.131 kg)  BMI 38.36 kg/m2  SpO2 98% Physical Exam  Constitutional: He is oriented to person, place, and time. He appears well-developed and well-nourished. No distress.  HENT:  Head: Normocephalic and atraumatic.  Mouth/Throat: Oropharynx is clear and moist.  Neck: Normal range of motion. Neck supple.  Cardiovascular: Normal rate, regular rhythm and normal heart sounds.   No murmur heard. Pulmonary/Chest: Effort normal and breath sounds normal. No respiratory distress. He has no wheezes.  Lymphadenopathy:    He has no cervical adenopathy.  Neurological: He is alert and oriented to person, place, and time.  Skin: Skin is warm and dry. He is not diaphoretic.  Nursing note and vitals reviewed.   ED Course  Procedures (including critical care time) Labs Review Labs Reviewed - No data to display  Imaging Review Ct Soft Tissue Neck W Contrast  05/25/2015   CLINICAL DATA:  Initial evaluation for are acute left-sided neck pain, difficulty swelling, difficulty breathing.  EXAM: CT NECK WITH CONTRAST  TECHNIQUE: Multidetector CT imaging of the neck was performed using the standard protocol following the bolus administration of intravenous contrast.  CONTRAST:  75mL OMNIPAQUE IOHEXOL 300 MG/ML  SOLN  COMPARISON:  Prior radiograph from earlier the same day.  FINDINGS: Visualized portions of the brain are unremarkable. Partially visualized globes within normal limits.  Paranasal sinuses and mastoid air cells are clear.  Salivary glands including the parotid glands and submandibular glands are normal in appearance.  Evaluation of the oral cavity limited by motion artifact. No mass lesion or loculated fluid collection  identified. Palatine tonsils are grossly symmetric. Parapharyngeal fat is well preserved bilaterally. Oropharynx and nasopharynx within normal limits. No retropharyngeal fluid collection. Epiglottis is normal. Vallecula is clear. Lingual tonsils normal. Hypopharynx and supraglottic larynx demonstrate no acute abnormality. True vocal cords are symmetric and normal in appearance bilaterally. Subglottic airway is clear. No radiopaque foreign body identified.  No pathologically enlarged lymph nodes identified within the neck. Thyroid gland is normal. Visualized superior mediastinum within normal limits.  Visualized lung apices are clear without acute process.  Normal intravascular enhancement seen within the neck.  No acute osseous abnormality. No worrisome lytic or blastic osseous lesions.  IMPRESSION: No acute abnormality identified within the neck. No radiopaque foreign body identified. No adenopathy or acute inflammatory changes identified.   Electronically Signed   By: Rise Mu M.D.   On: 05/25/2015 21:13     EKG Interpretation None      MDM   Final diagnoses:  None    Oropharynx appears clear. As the patient states he cannot tolerate this taste of his Biaxin and cannot take pills, I will change his prescription to Zithromax suspension. Nothing appears emergent or acute not believe he is appropriate for discharge if he is not improving I have encouraged him to follow-up with his primary doctor.  Also of note is that this is his fifth visit to the emergency department for this problem.    Geoffery Lyons, MD 05/27/15 [phone]

## 2015-05-27 NOTE — ED Notes (Addendum)
Pt report he seen his doctor today, Dr. Farris Has( urgent care in [place]) discussing his issue with the taste of the antibiotic that he started taking today. One is amoxicillin the other he is unsure of the name yet that is the pill that was causing his distaste.Pt reports that at the time of taking the pill he felt it was difficult to swallow and mixed it with fruit juice.  Pt reports no trouble breathing or swallow at this time.

## 2015-05-27 NOTE — ED Notes (Signed)
Pt c/o bad taste in mouth and difficulty swallowing after taking liquid abx and feels "cutting my breath off"-NAD

## 2015-05-28 LAB — CULTURE, GROUP A STREP: STREP A CULTURE: NEGATIVE

## 2015-05-30 ENCOUNTER — Encounter (HOSPITAL_BASED_OUTPATIENT_CLINIC_OR_DEPARTMENT_OTHER): Payer: Self-pay | Admitting: Emergency Medicine

## 2015-05-30 DIAGNOSIS — Z79899 Other long term (current) drug therapy: Secondary | ICD-10-CM | POA: Diagnosis not present

## 2015-05-30 DIAGNOSIS — E780 Pure hypercholesterolemia: Secondary | ICD-10-CM | POA: Insufficient documentation

## 2015-05-30 DIAGNOSIS — F458 Other somatoform disorders: Secondary | ICD-10-CM | POA: Insufficient documentation

## 2015-05-30 DIAGNOSIS — K219 Gastro-esophageal reflux disease without esophagitis: Secondary | ICD-10-CM | POA: Insufficient documentation

## 2015-05-30 DIAGNOSIS — E860 Dehydration: Secondary | ICD-10-CM | POA: Insufficient documentation

## 2015-05-30 DIAGNOSIS — R531 Weakness: Secondary | ICD-10-CM | POA: Diagnosis present

## 2015-05-30 NOTE — ED Notes (Signed)
Pt reports that he started feeling week and dizzy and dehydrated

## 2015-05-31 ENCOUNTER — Emergency Department (HOSPITAL_BASED_OUTPATIENT_CLINIC_OR_DEPARTMENT_OTHER)
Admission: EM | Admit: 2015-05-31 | Discharge: 2015-05-31 | Disposition: A | Discharge status: Home / Self Care | Payer: BLUE CROSS/BLUE SHIELD | Attending: Emergency Medicine | Admitting: Emergency Medicine

## 2015-05-31 DIAGNOSIS — E860 Dehydration: Secondary | ICD-10-CM

## 2015-05-31 DIAGNOSIS — R0989 Other specified symptoms and signs involving the circulatory and respiratory systems: Secondary | ICD-10-CM

## 2015-05-31 LAB — COMPREHENSIVE METABOLIC PANEL
ALK PHOS: 70 U/L (ref 38–126)
ALT: 38 U/L (ref 17–63)
ANION GAP: 8 (ref 5–15)
AST: 21 U/L (ref 15–41)
Albumin: 4.3 g/dL (ref 3.5–5.0)
BUN: 11 mg/dL (ref 6–20)
CO2: 28 mmol/L (ref 22–32)
CREATININE: 1.15 mg/dL (ref 0.61–1.24)
Calcium: 9.4 mg/dL (ref 8.9–10.3)
Chloride: 101 mmol/L (ref 101–111)
GFR calc non Af Amer: >60 mL/min (ref >60)
Glucose, Bld: 88 mg/dL (ref 65–99)
Potassium: 3.7 mmol/L (ref 3.5–5.1)
Sodium: 137 mmol/L (ref 135–145)
TOTAL PROTEIN: 7.7 g/dL (ref 6.5–8.1)
Total Bilirubin: 0.9 mg/dL (ref 0.3–1.2)

## 2015-05-31 LAB — URINALYSIS, ROUTINE W REFLEX MICROSCOPIC
BILIRUBIN URINE: NEGATIVE
Glucose, UA: NEGATIVE mg/dL
Hgb urine dipstick: NEGATIVE
Ketones, ur: 15 mg/dL — AB
LEUKOCYTES UA: NEGATIVE
NITRITE: NEGATIVE
PH: 6 (ref 5.0–8.0)
PROTEIN: NEGATIVE mg/dL
SPECIFIC GRAVITY, URINE: 1.015 (ref 1.005–1.030)
Urobilinogen, UA: 1 mg/dL (ref 0.0–1.0)

## 2015-05-31 LAB — CBC WITH DIFFERENTIAL/PLATELET
Basophils Absolute: 0 10*3/uL (ref 0.0–0.1)
Basophils Relative: 0 % (ref 0–1)
EOS ABS: 0.2 10*3/uL (ref 0.0–0.7)
Eosinophils Relative: 2 % (ref 0–5)
HEMATOCRIT: 48.5 % (ref 39.0–52.0)
Hemoglobin: 16.7 g/dL (ref 13.0–17.0)
LYMPHS ABS: 2.5 10*3/uL (ref 0.7–4.0)
LYMPHS PCT: 27 % (ref 12–46)
MCH: 31 pg (ref 26.0–34.0)
MCHC: 34.4 g/dL (ref 30.0–36.0)
MCV: 90 fL (ref 78.0–100.0)
Monocytes Absolute: 0.6 10*3/uL (ref 0.1–1.0)
Monocytes Relative: 6 % (ref 3–12)
NEUTROS PCT: 65 % (ref 43–77)
Neutro Abs: 6 10*3/uL (ref 1.7–7.7)
Platelets: 254 10*3/uL (ref 150–400)
RBC: 5.39 MIL/uL (ref 4.22–5.81)
RDW: 12.3 % (ref 11.5–15.5)
WBC: 9.3 10*3/uL (ref 4.0–10.5)

## 2015-05-31 MED ORDER — SODIUM CHLORIDE 0.9 % IV BOLUS (SEPSIS)
1000.0000 mL | Freq: Once | INTRAVENOUS | Status: AC
  Administered 2015-05-31: 1000 mL via INTRAVENOUS

## 2015-05-31 NOTE — ED Provider Notes (Signed)
CSN: [id number]     Arrival date & time 05/30/15  2204 History   First MD Initiated Contact with Patient 05/31/15 0235     Chief Complaint  Patient presents with  . Weakness     (Consider location/radiation/quality/duration/timing/severity/associated sxs/prior Treatment) HPI  This is a 44 year old male who has had 5 recent visits to the ED for dysphagia. He has been treated with multiple anti-biotics for suspected Helicobacter pylori infection. He has had complaints of his anti-biotics giving him a bad taste in his mouth and he has also been diagnosed with globus sensation.  His anti-biotic's were recently discontinued due to diarrhea.  As a result of his dysphagia he has had significantly decreased oral intake particularly of solids. Because of this and the diarrhea he states he feels dehydrated and weak and thinks he needs IV fluids. He denies nausea or vomiting. He does complain of anxiety that prevents him from wanting to swallow even his own saliva at times.   He has had an endoscopy that showed changes consistent with GERD. He is scheduled to see an ENT tomorrow. He also has a barium swallow scheduled.   Past Medical History  Diagnosis Date  . GERD (gastroesophageal reflux disease)   . High cholesterol    History reviewed. No pertinent past surgical history. History reviewed. No pertinent family history. History  Substance Use Topics  . Smoking status: Never Smoker   . Smokeless tobacco: Not on file  . Alcohol Use: No    Review of Systems  All other systems reviewed and are negative.   Allergies  Review of patient's allergies indicates no known allergies.  Home Medications   Prior to Admission medications   Medication Sig Start Date End Date Taking? Authorizing Provider  albuterol (PROVENTIL HFA;VENTOLIN HFA) 108 (90 BASE) MCG/ACT inhaler Inhale 1 puff into the lungs every 6 (six) hours as needed for wheezing or shortness of breath.    Historical Provider, MD   atorvastatin (LIPITOR) 20 MG tablet Take 20 mg by mouth daily.    Historical Provider, MD  azithromycin (ZITHROMAX) 200 MG/5ML suspension Take 6.3 mLs (250 mg total) by mouth daily. 05/27/15   Geoffery Lyons, MD  baclofen (LIORESAL) 10 MG tablet Take 10 mg by mouth 3 (three) times daily.    Historical Provider, MD  LORazepam (ATIVAN) 1 MG tablet Take 1 tablet (1 mg total) by mouth 3 (three) times daily as needed for anxiety. 05/24/15   Marisa Severin, MD  omeprazole (PRILOSEC) 20 MG capsule Take 20 mg by mouth daily.    Historical Provider, MD  OMEPRAZOLE PO Take by mouth.    Historical Provider, MD  UNKNOWN TO PATIENT Cholesterol med    Historical Provider, MD   BP 113/78 mmHg  Pulse 61  Temp(Src) 99 F (37.2 C) (Oral)  Resp 18  SpO2 100%   Physical Exam  General: Well-developed, well-nourished male in no acute distress; appearance consistent with age of record HENT: normocephalic; atraumatic; mucous membranes dry Eyes: pupils equal, round and reactive to light; extraocular muscles intact Neck: supple Heart: regular rate and rhythm Lungs: clear to auscultation bilaterally Abdomen: soft; nondistended; nontender; no masses or hepatosplenomegaly; bowel sounds present Extremities: No deformity; full range of motion; pulses normal Neurologic: Awake, alert and oriented; motor function intact in all extremities and symmetric; no facial droop Skin: Warm and dry Psychiatric: Flat affect   ED Course  Procedures (including critical care time)   MDM  Nursing notes and vitals signs, including pulse oximetry, reviewed.  Summary of this visit's results, reviewed by myself:  Labs:  Results for orders placed or performed during the hospital encounter of 05/31/15 (from the past 24 hour(s))  CBC with Differential/Platelet     Status: None   Collection Time: 05/31/15  2:00 AM  Result Value Ref Range   WBC 9.3 4.0 - 10.5 K/uL   RBC 5.39 4.22 - 5.81 MIL/uL   Hemoglobin 16.7 13.0 - 17.0 g/dL   HCT  16.1 09.6 - 04.5 %   MCV 90.0 78.0 - 100.0 fL   MCH 31.0 26.0 - 34.0 pg   MCHC 34.4 30.0 - 36.0 g/dL   RDW 40.9 81.1 - 91.4 %   Platelets 254 150 - 400 K/uL   Neutrophils Relative % 65 43 - 77 %   Neutro Abs 6.0 1.7 - 7.7 K/uL   Lymphocytes Relative 27 12 - 46 %   Lymphs Abs 2.5 0.7 - 4.0 K/uL   Monocytes Relative 6 3 - 12 %   Monocytes Absolute 0.6 0.1 - 1.0 K/uL   Eosinophils Relative 2 0 - 5 %   Eosinophils Absolute 0.2 0.0 - 0.7 K/uL   Basophils Relative 0 0 - 1 %   Basophils Absolute 0.0 0.0 - 0.1 K/uL  Comprehensive metabolic panel     Status: None   Collection Time: 05/31/15  2:00 AM  Result Value Ref Range   Sodium 137 135 - 145 mmol/L   Potassium 3.7 3.5 - 5.1 mmol/L   Chloride 101 101 - 111 mmol/L   CO2 28 22 - 32 mmol/L   Glucose, Bld 88 65 - 99 mg/dL   BUN 11 6 - 20 mg/dL   Creatinine, Ser 7.82 0.61 - 1.24 mg/dL   Calcium 9.4 8.9 - 95.6 mg/dL   Total Protein 7.7 6.5 - 8.1 g/dL   Albumin 4.3 3.5 - 5.0 g/dL   AST 21 15 - 41 U/L   ALT 38 17 - 63 U/L   Alkaline Phosphatase 70 38 - 126 U/L   Total Bilirubin 0.9 0.3 - 1.2 mg/dL   GFR calc non Af Amer >60 >60 mL/min   GFR calc Af Amer >60 >60 mL/min   Anion gap 8 5 - 15  Urinalysis, Routine w reflex microscopic (not at [facility])     Status: Abnormal   Collection Time: 05/31/15  3:34 AM  Result Value Ref Range   Color, Urine YELLOW YELLOW   APPearance CLEAR CLEAR   Specific Gravity, Urine 1.015 1.005 - 1.030   pH 6.0 5.0 - 8.0   Glucose, UA NEGATIVE NEGATIVE mg/dL   Hgb urine dipstick NEGATIVE NEGATIVE   Bilirubin Urine NEGATIVE NEGATIVE   Ketones, ur 15 (A) NEGATIVE mg/dL   Protein, ur NEGATIVE NEGATIVE mg/dL   Urobilinogen, UA 1.0 0.0 - 1.0 mg/dL   Nitrite NEGATIVE NEGATIVE   Leukocytes, UA NEGATIVE NEGATIVE       Paula Libra, MD 05/31/15 2130

## 2016-03-20 ENCOUNTER — Emergency Department (HOSPITAL_BASED_OUTPATIENT_CLINIC_OR_DEPARTMENT_OTHER): Payer: BLUE CROSS/BLUE SHIELD

## 2016-03-20 ENCOUNTER — Encounter (HOSPITAL_BASED_OUTPATIENT_CLINIC_OR_DEPARTMENT_OTHER): Payer: Self-pay | Admitting: Emergency Medicine

## 2016-03-20 ENCOUNTER — Emergency Department (HOSPITAL_BASED_OUTPATIENT_CLINIC_OR_DEPARTMENT_OTHER)
Admission: EM | Admit: 2016-03-20 | Discharge: 2016-03-20 | Disposition: A | Discharge status: Home / Self Care | Payer: BLUE CROSS/BLUE SHIELD | Attending: Emergency Medicine | Admitting: Emergency Medicine

## 2016-03-20 DIAGNOSIS — K2970 Gastritis, unspecified, without bleeding: Secondary | ICD-10-CM | POA: Diagnosis not present

## 2016-03-20 DIAGNOSIS — R072 Precordial pain: Secondary | ICD-10-CM | POA: Diagnosis present

## 2016-03-20 DIAGNOSIS — Z79899 Other long term (current) drug therapy: Secondary | ICD-10-CM | POA: Insufficient documentation

## 2016-03-20 LAB — CBC
HEMATOCRIT: 46.3 % (ref 39.0–52.0)
Hemoglobin: 16.1 g/dL (ref 13.0–17.0)
MCH: 31.1 pg (ref 26.0–34.0)
MCHC: 34.8 g/dL (ref 30.0–36.0)
MCV: 89.4 fL (ref 78.0–100.0)
Platelets: 201 10*3/uL (ref 150–400)
RBC: 5.18 MIL/uL (ref 4.22–5.81)
RDW: 12.5 % (ref 11.5–15.5)
WBC: 7.6 10*3/uL (ref 4.0–10.5)

## 2016-03-20 LAB — BASIC METABOLIC PANEL
Anion gap: 8 (ref 5–15)
BUN: 20 mg/dL (ref 6–20)
CHLORIDE: 104 mmol/L (ref 101–111)
CO2: 26 mmol/L (ref 22–32)
Calcium: 9 mg/dL (ref 8.9–10.3)
Creatinine, Ser: 1 mg/dL (ref 0.61–1.24)
Glucose, Bld: 108 mg/dL — ABNORMAL HIGH (ref 65–99)
POTASSIUM: 3.6 mmol/L (ref 3.5–5.1)
SODIUM: 138 mmol/L (ref 135–145)

## 2016-03-20 LAB — LIPASE, BLOOD: LIPASE: 28 U/L (ref 11–51)

## 2016-03-20 LAB — TROPONIN I: Troponin I: <0.03 ng/mL (ref <0.031)

## 2016-03-20 MED ORDER — RANITIDINE HCL 150 MG PO CAPS: 150.0000 mg | ORAL_CAPSULE | Freq: Every evening | ORAL | Status: AC

## 2016-03-20 MED ORDER — GI COCKTAIL ~~LOC~~
30.0000 mL | Freq: Once | ORAL | Status: AC
  Administered 2016-03-20: 30 mL via ORAL
  Filled 2016-03-20: qty 30

## 2016-03-20 MED FILL — raNITIdine HCL 150 MG TABS: 150 | 30 days supply | Qty: 30 | Fill #0

## 2016-03-20 NOTE — ED Notes (Signed)
Pt having epigastric chest pain since Sunday after eating.  No sob, no diaphoresis, no n/v.  Pt states pain is constant but has decreased over time.

## 2016-03-20 NOTE — ED Provider Notes (Signed)
CSN: [id number]     Arrival date & time 03/20/16  1000 History   First MD Initiated Contact with Patient 03/20/16 1028     Chief Complaint  Patient presents with  . Chest Pain     (Consider location/radiation/quality/duration/timing/severity/associated sxs/prior Treatment) HPI Comments: Patient is a 45 year old male with past medical history of reflux and peptic ulcer disease. He presents for evaluation of epigastric and lower chest discomfort which is been going on for the past 2 days. He denies any shortness of breath, diaphoresis, nausea, or radiation to the arm or jaw. He denies any vomiting or diarrhea. He denies any exertional symptoms.  Patient is a 45 y.o. male presenting with chest pain. The history is provided by the patient.  Chest Pain Pain location:  Substernal area and epigastric Pain quality: tightness   Pain radiates to:  Does not radiate Pain radiates to the back: no   Pain severity:  Moderate Onset quality:  Sudden Duration:  2 days Timing:  Constant Progression:  Worsening Chronicity:  Recurrent Relieved by:  Nothing Worsened by:  Nothing tried Ineffective treatments:  None tried   Past Medical History  Diagnosis Date  . GERD (gastroesophageal reflux disease)   . High cholesterol    No past surgical history on file. No family history on file. Social History  Substance Use Topics  . Smoking status: Never Smoker   . Smokeless tobacco: None  . Alcohol Use: No    Review of Systems  Cardiovascular: Positive for chest pain.  All other systems reviewed and are negative.     Allergies  Review of patient's allergies indicates no known allergies.  Home Medications   Prior to Admission medications   Medication Sig Start Date End Date Taking? Authorizing Provider  clonazePAM (KLONOPIN) 1 MG tablet Take 1 mg by mouth at bedtime.   Yes Historical Provider, MD  Dexlansoprazole (DEXILANT) 30 MG capsule Take 30 mg by mouth daily.   Yes Historical Provider,  MD  albuterol (PROVENTIL HFA;VENTOLIN HFA) 108 (90 BASE) MCG/ACT inhaler Inhale 1 puff into the lungs every 6 (six) hours as needed for wheezing or shortness of breath.    Historical Provider, MD  atorvastatin (LIPITOR) 20 MG tablet Take 20 mg by mouth daily.    Historical Provider, MD  azithromycin (ZITHROMAX) 200 MG/5ML suspension Take 6.3 mLs (250 mg total) by mouth daily. 05/27/15   Geoffery Lyons, MD  baclofen (LIORESAL) 10 MG tablet Take 10 mg by mouth 3 (three) times daily.    Historical Provider, MD  LORazepam (ATIVAN) 1 MG tablet Take 1 tablet (1 mg total) by mouth 3 (three) times daily as needed for anxiety. 05/24/15   Marisa Severin, MD  OMEPRAZOLE PO Take by mouth.    Historical Provider, MD   BP 147/100 mmHg  Pulse 77  Temp(Src) 98.2 F (36.8 C) (Oral)  Resp 16  Ht  (1.702 m)  Wt 260 lb (117.935 kg)  BMI 40.71 kg/m2  SpO2 95% Physical Exam  Constitutional: He is oriented to person, place, and time. He appears well-developed and well-nourished. No distress.  HENT:  Head: Normocephalic and atraumatic.  Mouth/Throat: Oropharynx is clear and moist.  Neck: Normal range of motion. Neck supple.  Cardiovascular: Normal rate and regular rhythm.  Exam reveals no friction rub.   No murmur heard. Pulmonary/Chest: Effort normal and breath sounds normal. No respiratory distress. He has no wheezes. He has no rales.  Abdominal: Soft. Bowel sounds are normal. He exhibits no distension. There  is no tenderness.  Musculoskeletal: Normal range of motion. He exhibits no edema.  Neurological: He is alert and oriented to person, place, and time. Coordination normal.  Skin: Skin is warm and dry. He is not diaphoretic.  Nursing note and vitals reviewed.   ED Course  Procedures (including critical care time) Labs Review Labs Reviewed  BASIC METABOLIC PANEL  CBC  TROPONIN I  LIPASE, BLOOD    Imaging Review No results found. I have personally reviewed and evaluated these images and lab  results as part of my medical decision-making.   EKG Interpretation   Date/Time:  Tuesday Mar 20 2016 10:14:53 EDT Ventricular Rate:  84 PR Interval:  198 QRS Duration: 96 QT Interval:  353 QTC Calculation: 417 R Axis:   60 Text Interpretation:  Sinus rhythm Normal ECG Confirmed by Davanna He  MD,  Sandon Yoho ([id number]) on 03/20/2016 12:08:26 PM      MDM   Final diagnoses:  None    Patient presents with complaints of epigastric discomfort. He has a history of reflux and anxiety. His symptoms are worse with eating and I suspect are related to acid reflux/gastritis. He is currently taking dexilant for this. His workup does not reveal a cardiac etiology and his symptoms improved with a GI cocktail. At this point I feel as though he is appropriate for discharge. I will add an evening dose of Zantac which he can take with meals. To return as needed for any problems.    Geoffery Lyonsouglas Ercia Crisafulli, MD 03/20/16 1240

## 2016-03-20 NOTE — Discharge Instructions (Signed)
Continue taking Dexilant as previously prescribed. Begin taking Zantac 150 mg once daily in the evening.  Follow-up with your primary Dr. or gastroenterologist in the next week, and return to the ER symptoms significantly worsen or change.   Gastritis, Adult Gastritis is soreness and swelling (inflammation) of the lining of the stomach. Gastritis can develop as a sudden onset (acute) or long-term (chronic) condition. If gastritis is not treated, it can lead to stomach bleeding and ulcers. CAUSES  Gastritis occurs when the stomach lining is weak or damaged. Digestive juices from the stomach then inflame the weakened stomach lining. The stomach lining may be weak or damaged due to viral or bacterial infections. One common bacterial infection is the Helicobacter pylori infection. Gastritis can also result from excessive alcohol consumption, taking certain medicines, or having too much acid in the stomach.  SYMPTOMS  In some cases, there are no symptoms. When symptoms are present, they may include:  Pain or a burning sensation in the upper abdomen.  Nausea.  Vomiting.  An uncomfortable feeling of fullness after eating. DIAGNOSIS  Your caregiver may suspect you have gastritis based on your symptoms and a physical exam. To determine the cause of your gastritis, your caregiver may perform the following:  Blood or stool tests to check for the H pylori bacterium.  Gastroscopy. A thin, flexible tube (endoscope) is passed down the esophagus and into the stomach. The endoscope has a light and camera on the end. Your caregiver uses the endoscope to view the inside of the stomach.  Taking a tissue sample (biopsy) from the stomach to examine under a microscope. TREATMENT  Depending on the cause of your gastritis, medicines may be prescribed. If you have a bacterial infection, such as an H pylori infection, antibiotics may be given. If your gastritis is caused by too much acid in the stomach, H2 blockers  or antacids may be given. Your caregiver may recommend that you stop taking aspirin, ibuprofen, or other nonsteroidal anti-inflammatory drugs (NSAIDs). HOME CARE INSTRUCTIONS  Only take over-the-counter or prescription medicines as directed by your caregiver.  If you were given antibiotic medicines, take them as directed. Finish them even if you start to feel better.  Drink enough fluids to keep your urine clear or pale yellow.  Avoid foods and drinks that make your symptoms worse, such as:  Caffeine or alcoholic drinks.  Chocolate.  Peppermint or mint flavorings.  Garlic and onions.  Spicy foods.  Citrus fruits, such as oranges, lemons, or limes.  Tomato-based foods such as sauce, chili, salsa, and pizza.  Fried and fatty foods.  Eat small, frequent meals instead of large meals. SEEK IMMEDIATE MEDICAL CARE IF:   You have black or dark red stools.  You vomit blood or material that looks like coffee grounds.  You are unable to keep fluids down.  Your abdominal pain gets worse.  You have a fever.  You do not feel better after 1 week.  You have any other questions or concerns. MAKE SURE YOU:  Understand these instructions.  Will watch your condition.  Will get help right away if you are not doing well or get worse.   This information is not intended to replace advice given to you by your health care provider. Make sure you discuss any questions you have with your health care provider.   Document Released: 10/16/2001 Document Revised: 04/22/2012 Document Reviewed: 12/05/2011 Elsevier Interactive Patient Education [employer]2016 [employer] Inc[employer]

## 2016-05-07 ENCOUNTER — Emergency Department (INDEPENDENT_AMBULATORY_CARE_PROVIDER_SITE_OTHER)
Admission: EM | Admit: 2016-05-07 | Discharge: 2016-05-07 | Disposition: A | Discharge status: Home / Self Care | Payer: BLUE CROSS/BLUE SHIELD | Source: Home / Self Care | Attending: Family Medicine | Admitting: Family Medicine

## 2016-05-07 DIAGNOSIS — J029 Acute pharyngitis, unspecified: Secondary | ICD-10-CM

## 2016-05-07 DIAGNOSIS — Z872 Personal history of diseases of the skin and subcutaneous tissue: Secondary | ICD-10-CM

## 2016-05-07 DIAGNOSIS — B9789 Other viral agents as the cause of diseases classified elsewhere: Principal | ICD-10-CM

## 2016-05-07 DIAGNOSIS — J069 Acute upper respiratory infection, unspecified: Secondary | ICD-10-CM | POA: Diagnosis not present

## 2016-05-07 LAB — POCT RAPID STREP A (OFFICE): RAPID STREP A SCREEN: NEGATIVE

## 2016-05-07 MED ORDER — BENZONATATE 200 MG PO CAPS: 200.0000 mg | ORAL_CAPSULE | Freq: Every day | ORAL | Status: DC

## 2016-05-07 MED ORDER — TRIAMCINOLONE ACETONIDE 0.1 % EX CREA: 1.0000 "application " | TOPICAL_CREAM | Freq: Two times a day (BID) | CUTANEOUS | Status: DC

## 2016-05-07 MED ORDER — AZITHROMYCIN 250 MG PO TABS: ORAL_TABLET | ORAL | Status: DC

## 2016-05-07 MED ORDER — TRIAMCINOLONE ACETONIDE 40 MG/ML IJ SUSP
40.0000 mg | Freq: Once | INTRAMUSCULAR | Status: AC
  Administered 2016-05-07: 40 mg via INTRAMUSCULAR

## 2016-05-07 NOTE — Discharge Instructions (Signed)
Take plain guaifenesin (1200mg  extended release tabs such as Mucinex) twice daily, with plenty of water, for cough and congestion.  May continue Pseudoephedrine for sinus congestion.  Get adequate rest.   May use Afrin nasal spray (or generic oxymetazoline) twice daily for about 5 days and then discontinue.  Also recommend using saline nasal spray several times daily and saline nasal irrigation (AYR is a common brand).  Use Flonase nasal spray each morning after using Afrin nasal spray and saline nasal irrigation. Try warm salt water gargles for sore throat.  Stop all antihistamines for now, and other non-prescription cough/cold preparations. Begin Azithromycin if not improving about one week or if persistent fever develops  Follow-up with family doctor if not improving about10 days.

## 2016-05-07 NOTE — ED Notes (Signed)
Pt started with a sore throat 5 days ago.  Has a runny nose alternating with congestion.  Denies sinus pressure, ear pain.  Has a dry cough mostly, but occasionally coughs up a green mucous.

## 2016-05-07 NOTE — ED Provider Notes (Signed)
CSN: [id number]     Arrival date & time 05/07/16  1853 History   First MD Initiated Contact with Patient 05/07/16 1910     Chief Complaint  Patient presents with  . Sore Throat      HPI Comments: Patient presents with two problems: 1)  Patient complains of three day history of typical cold-like symptoms developing over several days,  including mild sore throat, sinus congestion, headache, fatigue, and cough.  He has a history of seasonal allergies and occasionally begins wheezing during allergy season although he has no history of asthma. 2)  He states that he has an allergy to metal in his belt buckle, and gets a pruritic rash on his abdomen when he wears the buckle regularly.  (assymptomatic at present)  The history is provided by the patient.    Past Medical History  Diagnosis Date  . GERD (gastroesophageal reflux disease)   . High cholesterol    History reviewed. No pertinent past surgical history. History reviewed. No pertinent family history. Social History  Substance Use Topics  . Smoking status: Never Smoker   . Smokeless tobacco: None  . Alcohol Use: No    Review of Systems + sore throat + cough No pleuritic pain No wheezing + nasal congestion + post-nasal drainage No sinus pain/pressure No itchy/red eyes No earache No hemoptysis No SOB + fever, + chills No nausea No vomiting No abdominal pain No diarrhea No urinary symptoms No skin rash + fatigue No myalgias + headache Used OTC meds without relief  Allergies  Review of patient's allergies indicates no known allergies.  Home Medications   Prior to Admission medications   Medication Sig Start Date End Date Taking? Authorizing Provider  albuterol (PROVENTIL HFA;VENTOLIN HFA) 108 (90 BASE) MCG/ACT inhaler Inhale 1 puff into the lungs every 6 (six) hours as needed for wheezing or shortness of breath.    Historical Provider, MD  atorvastatin (LIPITOR) 20 MG tablet Take 20 mg by mouth daily.    Historical  Provider, MD  azithromycin (ZITHROMAX Z-PAK) 250 MG tablet Take 2 tabs today; then begin one tab once daily for 4 more days. (Rx void after 05/15/16) 05/07/16   Lattie HawStephen A Nivek Powley, MD  baclofen (LIORESAL) 10 MG tablet Take 10 mg by mouth 3 (three) times daily.    Historical Provider, MD  benzonatate (TESSALON) 200 MG capsule Take 1 capsule (200 mg total) by mouth at bedtime. Take as needed for cough 05/07/16   Lattie HawStephen A Rajah Lamba, MD  clonazePAM (KLONOPIN) 1 MG tablet Take 1 mg by mouth at bedtime.    Historical Provider, MD  dexlansoprazole (DEXILANT) 60 MG capsule Take 60 mg by mouth daily.    Historical Provider, MD  LORazepam (ATIVAN) 1 MG tablet Take 1 tablet (1 mg total) by mouth 3 (three) times daily as needed for anxiety. 05/24/15   Marisa Severinlga Otter, MD  OMEPRAZOLE PO Take by mouth.    Historical Provider, MD  ranitidine (ZANTAC) 150 MG capsule Take 1 capsule (150 mg total) by mouth every evening. 03/20/16   Geoffery Lyonsouglas Delo, MD  triamcinolone cream (KENALOG) 0.1 % Apply 1 application topically 2 (two) times daily. Use as needed for allergic rash 05/07/16   Lattie HawStephen A Sallie Maker, MD   Meds Ordered and Administered this Visit   Medications  triamcinolone acetonide (KENALOG-40) injection 40 mg (40 mg Intramuscular Given 05/07/16 1953)    BP 121/82 mmHg  Pulse 76  Temp(Src) 98 F (36.7 C) (Oral)  Ht 5\' 7"  (1.702 m)  Wt 266 lb (120.657 kg)  BMI 41.65 kg/m2  SpO2 96% No data found.   Physical Exam Nursing notes and Vital Signs reviewed. Appearance:  Patient appears stated age, and in no acute distress Eyes:  Pupils are equal, round, and reactive to light and accomodation.  Extraocular movement is intact.  Conjunctivae are not inflamed  Ears:  Canals partly occluded with cerumen. Tympanic membranes normal.  Nose:  Congested turbinates.  No sinus tenderness.   Pharynx:  Normal Neck:  Supple.  Tender enlarged posterior/lateral nodes are palpated bilaterally  Lungs:  Faint rhonchi.  Breath sounds are equal.  Moving  air well. Heart:  Regular rate and rhythm without murmurs, rubs, or gallops.  Abdomen:  Nontender without masses or hepatosplenomegaly.  Bowel sounds are present.  No CVA or flank tenderness.  Extremities:  No edema.  Skin:  No rash present.   ED Course  Procedures none    Labs Reviewed  POCT RAPID STREP A (OFFICE) negative      MDM   1. Viral URI with cough   2. Viral pharyngitis   3. History of allergic contact dermatitis    There is no evidence of bacterial infection today.   Prescription written for Benzonatate [facility](Tessalon) to take at bedtime for night-time cough.  Administered Kenalog 40mg  IM. Rx for triamcinolone 0.1% cream for recurring rash to metal (assymptomatic at present) Take plain guaifenesin (1200mg  extended release tabs such as Mucinex) twice daily, with plenty of water, for cough and congestion.  May continue Pseudoephedrine for sinus congestion.  Get adequate rest.   May use Afrin nasal spray (or generic oxymetazoline) twice daily for about 5 days and then discontinue.  Also recommend using saline nasal spray several times daily and saline nasal irrigation (AYR is a common brand).  Use Flonase nasal spray each morning after using Afrin nasal spray and saline nasal irrigation. Try warm salt water gargles for sore throat.  Stop all antihistamines for now, and other non-prescription cough/cold preparations. Begin Azithromycin if not improving about one week or if persistent fever develops (Given a prescription to hold, with an expiration date)  Follow-up with family doctor if not improving about10 days.     Lattie HawStephen A Aydrien Froman, MD 05/07/16 2005

## 2016-10-05 ENCOUNTER — Emergency Department (INDEPENDENT_AMBULATORY_CARE_PROVIDER_SITE_OTHER)
Admission: EM | Admit: 2016-10-05 | Discharge: 2016-10-05 | Disposition: A | Discharge status: Home / Self Care | Payer: BLUE CROSS/BLUE SHIELD | Source: Home / Self Care | Attending: Family Medicine | Admitting: Family Medicine

## 2016-10-05 ENCOUNTER — Encounter: Payer: Self-pay | Admitting: Emergency Medicine

## 2016-10-05 DIAGNOSIS — R519 Headache, unspecified: Secondary | ICD-10-CM

## 2016-10-05 DIAGNOSIS — R42 Dizziness and giddiness: Secondary | ICD-10-CM

## 2016-10-05 DIAGNOSIS — H6121 Impacted cerumen, right ear: Secondary | ICD-10-CM

## 2016-10-05 DIAGNOSIS — R030 Elevated blood-pressure reading, without diagnosis of hypertension: Secondary | ICD-10-CM

## 2016-10-05 DIAGNOSIS — R51 Headache: Secondary | ICD-10-CM

## 2016-10-05 MED ORDER — CARBAMIDE PEROXIDE 6.5 % OT SOLN: 5.0000 [drp] | Freq: Two times a day (BID) | OTIC | 0 refills | Status: DC

## 2016-10-05 MED ORDER — MECLIZINE HCL 25 MG PO TABS: 25.0000 mg | ORAL_TABLET | Freq: Three times a day (TID) | ORAL | 0 refills | Status: DC | PRN

## 2016-10-05 NOTE — ED Triage Notes (Signed)
Pt c/o dizziness x 1 day, some headaches, no nausea, no vomitting

## 2016-10-05 NOTE — ED Provider Notes (Signed)
CSN: [id number]     Arrival date & time 10/05/16  1747 History   First MD Initiated Contact with Patient 10/05/16 1804     Chief Complaint  Patient presents with  . 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 Denies nausea, vomiting or change in vision. He did take tylenol earlier with moderate relief but headache and dizziness came back after taking a nap. He notes he does not eat as often throughout the day as he should due to his work and also does not drink as much water as he should. Denies chest pain, SOB, or palpitations. Denies hx of HTN but prior hx of high cholesterol. He use to take medication for his cholesterol but stopped last year. He has not f/u with his PCP for at least 1 year as he normally goes to an UC for his care.      Past Medical History:  Diagnosis Date  . GERD (gastroesophageal reflux disease)   . High cholesterol    History reviewed. No pertinent surgical history. No family history on file. Social History  Substance Use Topics  . Smoking status: Never Smoker  . Smokeless tobacco: Never Used  . Alcohol use No    Review of Systems  Constitutional: Negative for chills and fever.  Eyes: Negative for photophobia, pain and visual disturbance.  Respiratory: Negative for cough, chest tightness and shortness of breath.   Cardiovascular: Negative for chest pain and palpitations.  Neurological: Positive for dizziness and headaches. Negative for syncope, facial asymmetry, speech difficulty, weakness, light-headedness and numbness.    Allergies  Patient has no known allergies.  Home Medications   Prior to Admission medications   Medication Sig Start Date End Date Taking? Authorizing Provider  albuterol (PROVENTIL HFA;VENTOLIN HFA) 108 (90 BASE) MCG/ACT inhaler Inhale 1  puff into the lungs every 6 (six) hours as needed for wheezing or shortness of breath.    Historical Provider, MD  atorvastatin (LIPITOR) 20 MG tablet Take 20 mg by mouth daily.    Historical Provider, MD  azithromycin (ZITHROMAX Z-PAK) 250 MG tablet Take 2 tabs today; then begin one tab once daily for 4 more days. (Rx void after 05/15/16) 05/07/16   Lattie HawStephen A Beese, MD  baclofen (LIORESAL) 10 MG tablet Take 10 mg by mouth 3 (three) times daily.    Historical Provider, MD  benzonatate (TESSALON) 200 MG capsule Take 1 capsule (200 mg total) by mouth at bedtime. Take as needed for cough 05/07/16   Lattie HawStephen A Beese, MD  carbamide peroxide (DEBROX) 6.5 % otic solution Place 5 drops into both ears 2 (two) times daily. 10/05/16   Junius FinnerErin O'Malley, PA-C  clonazePAM (KLONOPIN) 1 MG tablet Take 1 mg by mouth at bedtime.    Historical Provider, MD  dexlansoprazole (DEXILANT) 60 MG capsule Take 60 mg by mouth daily.    Historical Provider, MD  LORazepam (ATIVAN) 1 MG tablet Take 1 tablet (1 mg total) by mouth 3 (three) times daily as needed for anxiety. 05/24/15   Marisa Severinlga Otter, MD  meclizine (ANTIVERT) 25 MG tablet Take 1 tablet (25 mg total) by mouth 3 (three) times daily as needed for dizziness. 10/05/16   Junius FinnerErin O'Malley, PA-C  OMEPRAZOLE PO Take by mouth.    Historical Provider, MD  ranitidine (ZANTAC) 150 MG capsule Take 1 capsule (150 mg total) by mouth every evening. 03/20/16  Geoffery Lyonsouglas Delo, MD  triamcinolone cream (KENALOG) 0.1 % Apply 1 application topically 2 (two) times daily. Use as needed for allergic rash 05/07/16   Lattie HawStephen A Beese, MD   Meds Ordered and Administered this Visit  Medications - No data to display  BP 158/100 (BP Location: Left Arm)   Pulse 80   Temp 98.2 F (36.8 C) (Oral)   Ht 5\' 6"  (1.676 m)   Wt 263 lb 8 oz (119.5 kg)   SpO2 98%   BMI 42.53 kg/m  No data found.   Physical Exam  Constitutional: He is oriented to person, place, and time. He appears well-developed and well-nourished. No  distress.  HENT:  Head: Normocephalic and atraumatic.  Left Ear: Tympanic membrane normal.  Nose: Nose normal.  Mouth/Throat: Uvula is midline, oropharynx is clear and moist and mucous membranes are normal.  Right ear: unable to visualize TM due to cerumen impaction.   Eyes: Conjunctivae and EOM are normal. Pupils are equal, round, and reactive to light. Right eye exhibits no discharge. Left eye exhibits no discharge.  Neck: Normal range of motion.  Cardiovascular: Normal rate and regular rhythm.   Pulmonary/Chest: Effort normal. No respiratory distress. He has no wheezes. He has no rales.  Musculoskeletal: Normal range of motion.  Neurological: He is alert and oriented to person, place, and time.  CN II-XII in tact. Speech is clear. Alert to person, place and time. Normal finger to nose coordination. Normal gait.   Skin: Skin is warm and dry. He is not diaphoretic.  Psychiatric: He has a normal mood and affect. His behavior is normal.  Nursing note and vitals reviewed.   Urgent Care Course   Clinical Course     Procedures (including critical care time)  Labs Review Labs Reviewed - No data to display  Imaging Review No results found.    MDM   1. Dizziness   2. Impacted cerumen of right ear   3. Elevated blood pressure reading   4. Generalized headache    BP elevated- 158/100. Denies chest pain or SOB. Denies vision change.   Right ear noted to have cerumen impaction. Normal neuro exam.  Symptoms could be due to elevated BP and/or from cerumen impaction. Attempted to flush ear w/o success.  Rx: debrox and meclizine  Encouraged to f/u with PCP next week for recheck of BP as he may need BP medication if it is elevated next week as well.  Pt plans to go to UC he normally goes to for his "PCP" tomorrow to be rechecked.  Discussed symptoms that warrant emergent care in the ED.     Junius Finnerrin O'Malley, PA-C 10/05/16 1919

## 2017-08-05 ENCOUNTER — Emergency Department (HOSPITAL_BASED_OUTPATIENT_CLINIC_OR_DEPARTMENT_OTHER)
Admission: EM | Admit: 2017-08-05 | Discharge: 2017-08-05 | Disposition: A | Discharge status: Home / Self Care | Payer: BLUE CROSS/BLUE SHIELD | Attending: Emergency Medicine | Admitting: Emergency Medicine

## 2017-08-05 ENCOUNTER — Emergency Department (HOSPITAL_BASED_OUTPATIENT_CLINIC_OR_DEPARTMENT_OTHER): Payer: BLUE CROSS/BLUE SHIELD

## 2017-08-05 ENCOUNTER — Encounter (HOSPITAL_BASED_OUTPATIENT_CLINIC_OR_DEPARTMENT_OTHER): Payer: Self-pay | Admitting: Emergency Medicine

## 2017-08-05 DIAGNOSIS — R1013 Epigastric pain: Secondary | ICD-10-CM | POA: Diagnosis not present

## 2017-08-05 DIAGNOSIS — R109 Unspecified abdominal pain: Secondary | ICD-10-CM | POA: Diagnosis present

## 2017-08-05 LAB — CBC WITH DIFFERENTIAL/PLATELET
Basophils Absolute: 0 10*3/uL (ref 0.0–0.1)
Basophils Relative: 0 %
EOS PCT: 0 %
Eosinophils Absolute: 0 10*3/uL (ref 0.0–0.7)
HCT: 48.5 % (ref 39.0–52.0)
Hemoglobin: 16.8 g/dL (ref 13.0–17.0)
LYMPHS ABS: 1 10*3/uL (ref 0.7–4.0)
Lymphocytes Relative: 11 %
MCH: 30.7 pg (ref 26.0–34.0)
MCHC: 34.6 g/dL (ref 30.0–36.0)
MCV: 88.7 fL (ref 78.0–100.0)
MONO ABS: 0.4 10*3/uL (ref 0.1–1.0)
MONOS PCT: 4 %
Neutro Abs: 8.3 10*3/uL — ABNORMAL HIGH (ref 1.7–7.7)
Neutrophils Relative %: 85 %
PLATELETS: 233 10*3/uL (ref 150–400)
RBC: 5.47 MIL/uL (ref 4.22–5.81)
RDW: 12.4 % (ref 11.5–15.5)
WBC: 9.8 10*3/uL (ref 4.0–10.5)

## 2017-08-05 LAB — COMPREHENSIVE METABOLIC PANEL
ALBUMIN: 4.4 g/dL (ref 3.5–5.0)
ALT: 30 U/L (ref 17–63)
AST: 24 U/L (ref 15–41)
Alkaline Phosphatase: 72 U/L (ref 38–126)
Anion gap: 7 (ref 5–15)
BUN: 14 mg/dL (ref 6–20)
CHLORIDE: 101 mmol/L (ref 101–111)
CO2: 26 mmol/L (ref 22–32)
CREATININE: 0.91 mg/dL (ref 0.61–1.24)
Calcium: 9.1 mg/dL (ref 8.9–10.3)
GFR calc Af Amer: >60 mL/min (ref >60)
GFR calc non Af Amer: >60 mL/min (ref >60)
GLUCOSE: 121 mg/dL — AB (ref 65–99)
POTASSIUM: 3.8 mmol/L (ref 3.5–5.1)
Sodium: 134 mmol/L — ABNORMAL LOW (ref 135–145)
Total Bilirubin: 0.4 mg/dL (ref 0.3–1.2)
Total Protein: 7.8 g/dL (ref 6.5–8.1)

## 2017-08-05 LAB — URINALYSIS, ROUTINE W REFLEX MICROSCOPIC
Bilirubin Urine: NEGATIVE
GLUCOSE, UA: NEGATIVE mg/dL
HGB URINE DIPSTICK: NEGATIVE
Ketones, ur: NEGATIVE mg/dL
Leukocytes, UA: NEGATIVE
Nitrite: NEGATIVE
PH: 7.5 (ref 5.0–8.0)
Protein, ur: NEGATIVE mg/dL
SPECIFIC GRAVITY, URINE: 1.02 (ref 1.005–1.030)

## 2017-08-05 LAB — LIPASE, BLOOD: LIPASE: 31 U/L (ref 11–51)

## 2017-08-05 MED ORDER — SUCRALFATE 1 GM/10ML PO SUSP: 1.0000 g | Freq: Three times a day (TID) | ORAL | 0 refills | Status: DC

## 2017-08-05 MED ORDER — GI COCKTAIL ~~LOC~~
30.0000 mL | Freq: Once | ORAL | Status: AC
  Administered 2017-08-05: 30 mL via ORAL
  Filled 2017-08-05: qty 30

## 2017-08-05 MED ORDER — PANTOPRAZOLE SODIUM 20 MG PO TBEC: 20.0000 mg | DELAYED_RELEASE_TABLET | Freq: Every day | ORAL | 0 refills | Status: DC

## 2017-08-05 MED ORDER — DICYCLOMINE HCL 20 MG PO TABS: 20.0000 mg | ORAL_TABLET | Freq: Two times a day (BID) | ORAL | 0 refills | Status: DC

## 2017-08-05 NOTE — Discharge Instructions (Signed)

## 2017-08-05 NOTE — ED Provider Notes (Signed)
Emergency Department Provider Note   I have reviewed the triage vital signs and the nursing notes.   HISTORY  Chief Complaint Abdominal Pain   HPI Casey Sims is a 46 y.o. male presents to the ED for evaluation of epigastric abdominal pain and nausea starting at 5 PM today. Denies any CP or SOB. Patient with similar pain in the past related to GERD. Described the pain as burning and worse with movement. No fever or chills. No vomiting. No change with food but patient is not very hungry. No diarrhea or constipation. Has seen GI in the past and reports that recent EGD was unremarkable but cannot recall the exact date or provider seen. Has been compliant with home medications. Patient was recently started on Clonazepam for the issue, by report.     Past Medical History:  Diagnosis Date  . GERD (gastroesophageal reflux disease)   . High cholesterol     There are no active problems to display for this patient.   History reviewed. No pertinent surgical history.  Current Outpatient Rx  . Order #: [id number] Class: Historical Med  . Order #: [id number] Class: Historical Med  . Order #: [id number] Class: Print  . Order #: [id number] Class: Historical Med  . Order #: [id number] Class: Print  . Order #: [id number] Class: Normal  . Order #: [id number] Class: Historical Med  . Order #: [id number] Class: Historical Med  . Order #: [id number] Class: Print  . Order #: [id number] Class: Print  . Order #: [id number] Class: Normal  . Order #: [id number] Class: Historical Med  . Order #: [id number] Class: Print  . Order #: [id number] Class: Print  . Order #: [id number] Class: Print  . Order #: [id number] Class: Print    Allergies Patient has no known allergies.  History reviewed. No pertinent family history.  Social History Social History  Substance Use Topics  . Smoking status: Never Smoker  . Smokeless tobacco: Never Used  . Alcohol use No    Review of Systems  Constitutional: No  fever/chills Eyes: No visual changes. ENT: No sore throat. Cardiovascular: Denies chest pain. Respiratory: Denies shortness of breath. Gastrointestinal: Positive epigatric abdominal pain. Positive nausea, no vomiting.  No diarrhea.  No constipation. Genitourinary: Negative for dysuria. Musculoskeletal: Negative for back pain. Skin: Negative for rash. Neurological: Negative for headaches, focal weakness or numbness.  10-point ROS otherwise negative.  ____________________________________________   PHYSICAL EXAM:  VITAL SIGNS: ED Triage Vitals  Enc Vitals Group     BP 08/05/17 2015 (!) 144/86     Pulse Rate 08/05/17 2015 60     Resp 08/05/17 2015 16     Temp 08/05/17 2015 98.2 F (36.8 C)     Temp Source 08/05/17 2015 Oral     SpO2 08/05/17 2015 99 %     Weight 08/05/17 2014 254 lb (115.2 kg)     Height 08/05/17 2014 5' 6.5" (1.689 m)     Pain Score 08/05/17 2025 7   Constitutional: Alert and oriented. Well appearing and in no acute distress but does seem slightly uncomfortable.  Eyes: Conjunctivae are normal.  Head: Atraumatic. Nose: No congestion/rhinnorhea. Mouth/Throat: Mucous membranes are moist.  Oropharynx non-erythematous. Neck: No stridor.   Cardiovascular: Normal rate, regular rhythm. Good peripheral circulation. Grossly normal heart sounds.   Respiratory: Normal respiratory effort.  No retractions. Lungs CTAB. Gastrointestinal: Soft with mild diffuse tenderness to palpation. No rebound or guarding. No focal RUQ tenderness. No distention.  Musculoskeletal: No lower extremity tenderness nor edema. No gross deformities of extremities. Neurologic:  Normal speech and language.  No gross focal neurologic deficits are appreciated.  Skin:  Skin is warm, dry and intact. No rash noted.  ____________________________________________   LABS (all labs ordered are listed, but only abnormal results are displayed)  Labs Reviewed  COMPREHENSIVE METABOLIC PANEL - Abnormal;  Notable for the following:       Result Value   Sodium 134 (*)    Glucose, Bld 121 (*)    All other components within normal limits  CBC WITH DIFFERENTIAL/PLATELET - Abnormal; Notable for the following:    Neutro Abs 8.3 (*)    All other components within normal limits  LIPASE, BLOOD  URINALYSIS, ROUTINE W REFLEX MICROSCOPIC   ____________________________________________  RADIOLOGY  Dg Abdomen Acute W/chest  Result Date: 08/05/2017 CLINICAL DATA:  Central abdominal pain, bloating, and nausea today. EXAM: DG ABDOMEN ACUTE W/ 1V CHEST COMPARISON:  Chest 03/20/2016 FINDINGS: Normal heart size and pulmonary vascularity. No focal airspace disease or consolidation in the lungs. No blunting of costophrenic angles. No pneumothorax. Mediastinal contours appear intact. Scattered gas and stool throughout the colon. No small or large bowel distention. No free intra-abdominal air. No abnormal air-fluid levels. No radiopaque stones. Visualized bones appear intact. IMPRESSION: No evidence of active pulmonary disease. Normal nonobstructive bowel gas pattern. Electronically Signed   By: Burman Nieves M.D.   On: 08/05/2017 22:11    ____________________________________________   PROCEDURES  Procedure(s) performed:   Procedures  None ____________________________________________   INITIAL IMPRESSION / ASSESSMENT AND PLAN / ED COURSE  Pertinent labs & imaging results that were available during my care of the patient were reviewed by me and considered in my medical decision making (see chart for details).  Patient with history of GI symptoms and currently followed by GI presents to the ED for evaluation of epigastric abdominal pain similar to GI pain experienced in the past. No CP. Low suspicion for atypical ACS given history of similar pain. Labs and imaging unremarkable. No lower abdominal discomfort to suggest GU etiology. Pain much improved with GI cocktail. Starting Carafate, Protonix, and  Bentyl at home. Patient to follow with PCP and GI in the coming 1-2 weeks.   At this time, I do not feel there is any life-threatening condition present. I have reviewed and discussed all results (EKG, imaging, lab, urine as appropriate), exam findings with patient. I have reviewed nursing notes and appropriate previous records.  I feel the patient is safe to be discharged home without further emergent workup. Discussed usual and customary return precautions. Patient and family (if present) verbalize understanding and are comfortable with this plan.  Patient will follow-up with their primary care provider. If they do not have a primary care provider, information for follow-up has been provided to them. All questions have been answered.   ____________________________________________  FINAL CLINICAL IMPRESSION(S) / ED DIAGNOSES  Final diagnoses:  Epigastric pain     MEDICATIONS GIVEN DURING THIS VISIT:  Medications  gi cocktail (Maalox,Lidocaine,Donnatal) (30 mLs Oral Given 08/05/17 2209)     NEW OUTPATIENT MEDICATIONS STARTED DURING THIS VISIT:  Discharge Medication List as of 08/05/2017 11:30 PM    START taking these medications   Details  dicyclomine (BENTYL) 20 MG tablet Take 1 tablet (20 mg total) by mouth 2 (two) times daily., Starting Mon 08/05/2017, Print    pantoprazole (PROTONIX) 20 MG tablet Take 1 tablet (20 mg total) by mouth daily., Starting Mon 08/05/2017, Until Wed 09/04/2017, Print    sucralfate (CARAFATE) 1 GM/10ML suspension Take 10 mLs (1 g total) by  mouth 4 (four) times daily -  with meals and at bedtime., Starting Mon 08/05/2017, Print        Note:  This document was prepared using Dragon voice recognition software and may include unintentional dictation errors.  Alona Bene, MD Emergency Medicine    Yari Szeliga, Arlyss Repress, MD 08/06/17 [phone]

## 2017-08-05 NOTE — ED Notes (Signed)
Patient transported to X-ray 

## 2017-08-05 NOTE — ED Triage Notes (Addendum)
Patient states that he has mid abdominal pain with nausea since about 5 pm today.  Patient  reports that he feel bloated. Reports a normal BM today

## 2018-10-30 ENCOUNTER — Observation Stay (HOSPITAL_BASED_OUTPATIENT_CLINIC_OR_DEPARTMENT_OTHER)
Admission: EM | Admit: 2018-10-30 | Discharge: 2018-10-31 | Disposition: A | Discharge status: Home / Self Care | Payer: BLUE CROSS/BLUE SHIELD | Attending: Surgery | Admitting: Surgery

## 2018-10-30 ENCOUNTER — Emergency Department (HOSPITAL_COMMUNITY): Payer: BLUE CROSS/BLUE SHIELD | Admitting: Certified Registered Nurse Anesthetist

## 2018-10-30 ENCOUNTER — Encounter (HOSPITAL_COMMUNITY)
Admission: EM | Disposition: A | Discharge status: Home / Self Care | Payer: Self-pay | Source: Home / Self Care | Attending: Emergency Medicine

## 2018-10-30 ENCOUNTER — Emergency Department (HOSPITAL_BASED_OUTPATIENT_CLINIC_OR_DEPARTMENT_OTHER): Payer: BLUE CROSS/BLUE SHIELD

## 2018-10-30 ENCOUNTER — Other Ambulatory Visit: Payer: Self-pay

## 2018-10-30 ENCOUNTER — Encounter (HOSPITAL_BASED_OUTPATIENT_CLINIC_OR_DEPARTMENT_OTHER): Payer: Self-pay | Admitting: Emergency Medicine

## 2018-10-30 DIAGNOSIS — K352 Acute appendicitis with generalized peritonitis, without abscess: Secondary | ICD-10-CM | POA: Diagnosis not present

## 2018-10-30 DIAGNOSIS — E7800 Pure hypercholesterolemia, unspecified: Secondary | ICD-10-CM | POA: Insufficient documentation

## 2018-10-30 DIAGNOSIS — Z79899 Other long term (current) drug therapy: Secondary | ICD-10-CM | POA: Diagnosis not present

## 2018-10-30 DIAGNOSIS — Z7951 Long term (current) use of inhaled steroids: Secondary | ICD-10-CM | POA: Insufficient documentation

## 2018-10-30 DIAGNOSIS — E785 Hyperlipidemia, unspecified: Secondary | ICD-10-CM | POA: Diagnosis not present

## 2018-10-30 DIAGNOSIS — K439 Ventral hernia without obstruction or gangrene: Secondary | ICD-10-CM | POA: Insufficient documentation

## 2018-10-30 DIAGNOSIS — N281 Cyst of kidney, acquired: Secondary | ICD-10-CM | POA: Insufficient documentation

## 2018-10-30 DIAGNOSIS — K3580 Unspecified acute appendicitis: Secondary | ICD-10-CM | POA: Diagnosis present

## 2018-10-30 DIAGNOSIS — Z6841 Body Mass Index (BMI) 40.0 and over, adult: Secondary | ICD-10-CM | POA: Insufficient documentation

## 2018-10-30 DIAGNOSIS — K219 Gastro-esophageal reflux disease without esophagitis: Secondary | ICD-10-CM | POA: Insufficient documentation

## 2018-10-30 HISTORY — PX: LAPAROSCOPIC APPENDECTOMY: SHX408

## 2018-10-30 LAB — URINALYSIS, ROUTINE W REFLEX MICROSCOPIC
Bilirubin Urine: NEGATIVE
Glucose, UA: NEGATIVE mg/dL
Hgb urine dipstick: NEGATIVE
Ketones, ur: NEGATIVE mg/dL
Leukocytes, UA: NEGATIVE
Nitrite: NEGATIVE
Protein, ur: NEGATIVE mg/dL
Specific Gravity, Urine: 1.025 (ref 1.005–1.030)
pH: 6.5 (ref 5.0–8.0)

## 2018-10-30 LAB — COMPREHENSIVE METABOLIC PANEL
ALT: 31 U/L (ref 0–44)
AST: 24 U/L (ref 15–41)
Albumin: 4.1 g/dL (ref 3.5–5.0)
Alkaline Phosphatase: 67 U/L (ref 38–126)
Anion gap: 9 (ref 5–15)
BUN: 14 mg/dL (ref 6–20)
CO2: 25 mmol/L (ref 22–32)
Calcium: 8.8 mg/dL — ABNORMAL LOW (ref 8.9–10.3)
Chloride: 100 mmol/L (ref 98–111)
Creatinine, Ser: 0.93 mg/dL (ref 0.61–1.24)
GFR calc Af Amer: >60 mL/min (ref >60)
GFR calc non Af Amer: >60 mL/min (ref >60)
Glucose, Bld: 142 mg/dL — ABNORMAL HIGH (ref 70–99)
Potassium: 3.6 mmol/L (ref 3.5–5.1)
Sodium: 134 mmol/L — ABNORMAL LOW (ref 135–145)
Total Bilirubin: 0.7 mg/dL (ref 0.3–1.2)
Total Protein: 7.3 g/dL (ref 6.5–8.1)

## 2018-10-30 LAB — CBC
HCT: 48.5 % (ref 39.0–52.0)
Hemoglobin: 16.3 g/dL (ref 13.0–17.0)
MCH: 30.3 pg (ref 26.0–34.0)
MCHC: 33.6 g/dL (ref 30.0–36.0)
MCV: 90.1 fL (ref 80.0–100.0)
Platelets: 220 10*3/uL (ref 150–400)
RBC: 5.38 MIL/uL (ref 4.22–5.81)
RDW: 12 % (ref 11.5–15.5)
WBC: 12.6 10*3/uL — ABNORMAL HIGH (ref 4.0–10.5)
nRBC: 0 % (ref 0.0–0.2)

## 2018-10-30 LAB — LIPASE, BLOOD: Lipase: 23 U/L (ref 11–51)

## 2018-10-30 SURGERY — APPENDECTOMY, LAPAROSCOPIC
Anesthesia: General | Site: Abdomen

## 2018-10-30 MED ORDER — LACTATED RINGERS IV SOLN
INTRAVENOUS | Status: DC | PRN
  Administered 2018-10-30 – 2018-10-31 (×3): via INTRAVENOUS

## 2018-10-30 MED ORDER — LACTATED RINGERS IR SOLN
Status: DC | PRN
  Administered 2018-10-30: 1000 mL

## 2018-10-30 MED ORDER — MORPHINE SULFATE (PF) 4 MG/ML IV SOLN
4.0000 mg | Freq: Once | INTRAVENOUS | Status: AC
  Administered 2018-10-30: 4 mg via INTRAVENOUS
  Filled 2018-10-30: qty 1

## 2018-10-30 MED ORDER — MEPERIDINE HCL 50 MG/ML IJ SOLN: 6.2500 mg | INTRAMUSCULAR | Status: DC | PRN

## 2018-10-30 MED ORDER — PROPOFOL 10 MG/ML IV BOLUS
INTRAVENOUS | Status: DC | PRN
  Administered 2018-10-30: 200 mg via INTRAVENOUS

## 2018-10-30 MED ORDER — KETOROLAC TROMETHAMINE 30 MG/ML IJ SOLN
30.0000 mg | Freq: Once | INTRAMUSCULAR | Status: AC
  Administered 2018-10-30: 30 mg via INTRAVENOUS
  Filled 2018-10-30: qty 1

## 2018-10-30 MED ORDER — HYDROMORPHONE HCL 1 MG/ML IJ SOLN: 0.2500 mg | INTRAMUSCULAR | Status: DC | PRN

## 2018-10-30 MED ORDER — LIDOCAINE 2% (20 MG/ML) 5 ML SYRINGE
INTRAMUSCULAR | Status: DC | PRN
  Administered 2018-10-30: 100 mg via INTRAVENOUS

## 2018-10-30 MED ORDER — KETOROLAC TROMETHAMINE 30 MG/ML IJ SOLN: 30.0000 mg | Freq: Once | INTRAMUSCULAR | Status: AC | PRN

## 2018-10-30 MED ORDER — ROCURONIUM BROMIDE 10 MG/ML (PF) SYRINGE
PREFILLED_SYRINGE | INTRAVENOUS | Status: DC | PRN
  Administered 2018-10-30: 10 mg via INTRAVENOUS
  Administered 2018-10-30: 45 mg via INTRAVENOUS
  Administered 2018-10-30: 5 mg via INTRAVENOUS

## 2018-10-30 MED ORDER — SODIUM CHLORIDE 0.9 % IV SOLN
INTRAVENOUS | Status: DC | PRN
  Administered 2018-10-30 (×2): 250 mL via INTRAVENOUS

## 2018-10-30 MED ORDER — PHENYLEPHRINE 40 MCG/ML (10ML) SYRINGE FOR IV PUSH (FOR BLOOD PRESSURE SUPPORT)
PREFILLED_SYRINGE | INTRAVENOUS | Status: DC | PRN
  Administered 2018-10-30 (×2): 80 ug via INTRAVENOUS

## 2018-10-30 MED ORDER — EPHEDRINE SULFATE-NACL 50-0.9 MG/10ML-% IV SOSY
PREFILLED_SYRINGE | INTRAVENOUS | Status: DC | PRN
  Administered 2018-10-30: 10 mg via INTRAVENOUS

## 2018-10-30 MED ORDER — SODIUM CHLORIDE 0.9 % IV SOLN
2.0000 g | Freq: Once | INTRAVENOUS | Status: AC
  Administered 2018-10-30: 2 g via INTRAVENOUS
  Filled 2018-10-30: qty 20

## 2018-10-30 MED ORDER — 0.9 % SODIUM CHLORIDE (POUR BTL) OPTIME
TOPICAL | Status: DC | PRN
  Administered 2018-10-30: 1000 mL

## 2018-10-30 MED ORDER — HYDROMORPHONE HCL 1 MG/ML IJ SOLN
0.5000 mg | Freq: Once | INTRAMUSCULAR | Status: AC
  Administered 2018-10-30: 0.5 mg via INTRAVENOUS

## 2018-10-30 MED ORDER — SUCCINYLCHOLINE CHLORIDE 20 MG/ML IJ SOLN
INTRAMUSCULAR | Status: DC | PRN
  Administered 2018-10-30: 140 mg via INTRAVENOUS

## 2018-10-30 MED ORDER — METRONIDAZOLE IN NACL 5-0.79 MG/ML-% IV SOLN
500.0000 mg | Freq: Once | INTRAVENOUS | Status: AC
  Administered 2018-10-30: 500 mg via INTRAVENOUS
  Filled 2018-10-30: qty 100

## 2018-10-30 MED ORDER — FENTANYL CITRATE (PF) 100 MCG/2ML IJ SOLN
INTRAMUSCULAR | Status: DC | PRN
  Administered 2018-10-30 (×2): 100 ug via INTRAVENOUS
  Administered 2018-10-31: 50 ug via INTRAVENOUS

## 2018-10-30 MED ORDER — ONDANSETRON HCL 4 MG/2ML IJ SOLN
4.0000 mg | Freq: Once | INTRAMUSCULAR | Status: AC
  Administered 2018-10-30: 4 mg via INTRAVENOUS
  Filled 2018-10-30: qty 2

## 2018-10-30 MED ORDER — MIDAZOLAM HCL 5 MG/5ML IJ SOLN
INTRAMUSCULAR | Status: DC | PRN
  Administered 2018-10-30: 2 mg via INTRAVENOUS

## 2018-10-30 MED ORDER — HYDROMORPHONE HCL 1 MG/ML IJ SOLN
INTRAMUSCULAR | Status: AC
  Filled 2018-10-30: qty 1

## 2018-10-30 MED ORDER — IOPAMIDOL (ISOVUE-300) INJECTION 61%
100.0000 mL | Freq: Once | INTRAVENOUS | Status: AC | PRN
  Administered 2018-10-30: 100 mL via INTRAVENOUS

## 2018-10-30 MED ORDER — FAMOTIDINE IN NACL 20-0.9 MG/50ML-% IV SOLN
20.0000 mg | Freq: Once | INTRAVENOUS | Status: AC
  Administered 2018-10-30: 20 mg via INTRAVENOUS
  Filled 2018-10-30: qty 50

## 2018-10-30 MED ORDER — PROMETHAZINE HCL 25 MG/ML IJ SOLN: 6.2500 mg | INTRAMUSCULAR | Status: DC | PRN

## 2018-10-30 SURGICAL SUPPLY — 36 items
APPLIER CLIP ROT 10 11.4 M/L (STAPLE)
BENZOIN TINCTURE PRP APPL 2/3 (GAUZE/BANDAGES/DRESSINGS) IMPLANT
CABLE HIGH FREQUENCY MONO STRZ (ELECTRODE) ×3 IMPLANT
CHLORAPREP W/TINT 26ML (MISCELLANEOUS) ×3 IMPLANT
CLIP APPLIE ROT 10 11.4 M/L (STAPLE) IMPLANT
CLOSURE WOUND 1/2 X4 (GAUZE/BANDAGES/DRESSINGS)
COVER SURGICAL LIGHT HANDLE (MISCELLANEOUS) ×3 IMPLANT
COVER WAND RF STERILE (DRAPES) IMPLANT
CUTTER FLEX LINEAR 45M (STAPLE) ×3 IMPLANT
DECANTER SPIKE VIAL GLASS SM (MISCELLANEOUS) ×3 IMPLANT
DERMABOND ADVANCED (GAUZE/BANDAGES/DRESSINGS) ×2
DERMABOND ADVANCED .7 DNX12 (GAUZE/BANDAGES/DRESSINGS) ×1 IMPLANT
DRAPE LAPAROSCOPIC ABDOMINAL (DRAPES) ×3 IMPLANT
ELECT REM PT RETURN 15FT ADLT (MISCELLANEOUS) ×3 IMPLANT
ENDOLOOP SUT PDS II  0 18 (SUTURE)
ENDOLOOP SUT PDS II 0 18 (SUTURE) IMPLANT
GLOVE SURG SIGNA 7.5 PF LTX (GLOVE) ×3 IMPLANT
GOWN STRL REUS W/TWL XL LVL3 (GOWN DISPOSABLE) ×6 IMPLANT
KIT BASIN OR (CUSTOM PROCEDURE TRAY) ×3 IMPLANT
POUCH SPECIMEN RETRIEVAL 10MM (ENDOMECHANICALS) ×3 IMPLANT
RELOAD 45 VASCULAR/THIN (ENDOMECHANICALS) IMPLANT
RELOAD STAPLE TA45 3.5 REG BLU (ENDOMECHANICALS) ×3 IMPLANT
SCISSORS LAP 5X35 DISP (ENDOMECHANICALS) ×3 IMPLANT
SET IRRIG TUBING LAPAROSCOPIC (IRRIGATION / IRRIGATOR) ×3 IMPLANT
SHEARS HARMONIC ACE PLUS 36CM (ENDOMECHANICALS) ×3 IMPLANT
SLEEVE XCEL OPT CAN 5 100 (ENDOMECHANICALS) ×3 IMPLANT
STRIP CLOSURE SKIN 1/2X4 (GAUZE/BANDAGES/DRESSINGS) IMPLANT
SUT MNCRL AB 4-0 PS2 18 (SUTURE) ×3 IMPLANT
SUT VIC AB 2-0 SH 18 (SUTURE) IMPLANT
TOWEL OR 17X26 10 PK STRL BLUE (TOWEL DISPOSABLE) ×3 IMPLANT
TOWEL OR NON WOVEN STRL DISP B (DISPOSABLE) ×3 IMPLANT
TRAY LAPAROSCOPIC (CUSTOM PROCEDURE TRAY) ×3 IMPLANT
TROCAR ADV FIXATION 5X100MM (TROCAR) ×3 IMPLANT
TROCAR BLADELESS OPT 5 100 (ENDOMECHANICALS) ×3 IMPLANT
TROCAR XCEL 12X100 BLDLESS (ENDOMECHANICALS) ×3 IMPLANT
TROCAR XCEL BLUNT TIP 100MML (ENDOMECHANICALS) ×3 IMPLANT

## 2018-10-30 NOTE — Anesthesia Procedure Notes (Signed)
Procedure Name: Intubation Performed by: Gean Maidens, CRNA Pre-anesthesia Checklist: Patient identified, Emergency Drugs available, Suction available, Patient being monitored and Timeout performed Patient Re-evaluated:Patient Re-evaluated prior to induction Oxygen Delivery Method: Circle system utilized Preoxygenation: Pre-oxygenation with 100% oxygen Induction Type: IV induction and Rapid sequence Laryngoscope Size: Mac and 4 Grade View: Grade I Tube type: Oral Tube size: 7.5 mm Number of attempts: 1 Airway Equipment and Method: Stylet Placement Confirmation: ETT inserted through vocal cords under direct vision,  positive ETCO2 and breath sounds checked- equal and bilateral Secured at: 23 cm Tube secured with: Tape Dental Injury: Teeth and Oropharynx as per pre-operative assessment

## 2018-10-30 NOTE — ED Triage Notes (Signed)
Generalized abd pain since this morning with nausea.

## 2018-10-30 NOTE — H&P (Signed)
Re:   Casey DinningWilmer Sims DOB:   27-Jan-1971 MRN:   [id number]  Chief Complaint Abdominal pain  ASSESEMENT AND PLAN: 1.  Appendicitis    I discussed with the patient the indications and risks of appendiceal surgery.  The primary risks of appendiceal surgery include, but are not limited to, bleeding, infection, bowel surgery, and open surgery.  There is also the risk that the patient may have continued symptoms after surgery.  We discussed the typical post-operative recovery course. I tried to answer the patient's questions.  2.  GERD 3.  Hypercholesterolemia 4.  Morbid obesity - BMI 40  Chief Complaint  Patient presents with  . Abdominal Pain   PHYSICIAN REQUESTING CONSULTATION: Michela PitcherMina Fawze, PA, [facility]  HISTORY OF PRESENT ILLNESS: Casey Sims is a 47 y.o. (DOB: 27-Jan-1971)  AA male whose primary care physician is Patient, No Pcp Per.  [facility] (but he cannot remember the name of the physician who sees him).   He has not prior history of abdominal or GI disease.   He does have some GERD.   He has not prior abdominal surgery.  He awoke around 8 AM today with abdominal pain.  He went to work, but he pain did not get any better.  [facility] of the worsening abdominal pain in his right abdomen.  He has not vomited.  But he has no appetite.  His CT scan of the abdomen - 10/30/2018 - CT findings consistent with acute un ruptured appendicitis. WBC - 10/30/2018 - 12,600    Past Medical History:  Diagnosis Date  . GERD (gastroesophageal reflux disease)   . High cholesterol      History reviewed. No pertinent surgical history.    Current Facility-Administered Medications  Medication Dose Route Frequency Provider Last Rate Last Dose  . 0.9 %  sodium chloride infusion   Intravenous PRN Raeford RazorKohut, Stephen, MD 10 mL/hr at 10/30/18 2032 250 mL at 10/30/18 2032  . metroNIDAZOLE (FLAGYL) IVPB 500 mg  500 mg Intravenous Once  Michela PitcherFawze, Mina A, PA-C 100 mL/hr at 10/30/18 2118 500 mg at 10/30/18 2118   Current Outpatient Medications  Medication Sig Dispense Refill  . albuterol (PROVENTIL HFA;VENTOLIN HFA) 108 (90 BASE) MCG/ACT inhaler Inhale 1 puff into the lungs every 6 (six) hours as needed for wheezing or shortness of breath.    Marland Kitchen. atorvastatin (LIPITOR) 20 MG tablet Take 20 mg by mouth daily.    Marland Kitchen. azithromycin (ZITHROMAX Z-PAK) 250 MG tablet Take 2 tabs today; then begin one tab once daily for 4 more days. (Rx void after 05/15/16) 6 tablet 0  . baclofen (LIORESAL) 10 MG tablet Take 10 mg by mouth 3 (three) times daily.    . benzonatate (TESSALON) 200 MG capsule Take 1 capsule (200 mg total) by mouth at bedtime. Take as needed for cough 12 capsule 0  . carbamide peroxide (DEBROX) 6.5 % otic solution Place 5 drops into both ears 2 (two) times daily. 15 mL 0  . clonazePAM (KLONOPIN) 1 MG tablet Take 1 mg by mouth at bedtime.    Marland Kitchen. dexlansoprazole (DEXILANT) 60 MG capsule Take 60 mg by mouth daily.    Marland Kitchen. dicyclomine (BENTYL) 20 MG tablet Take 1 tablet (20 mg total) by mouth 2 (two) times daily. 20 tablet 0  . LORazepam (ATIVAN) 1 MG tablet Take 1 tablet (1 mg total) by mouth 3 (three) times daily as needed for anxiety.  15 tablet 0  . meclizine (ANTIVERT) 25 MG tablet Take 1 tablet (25 mg total) by mouth 3 (three) times daily as needed for dizziness. 30 tablet 0  . OMEPRAZOLE PO Take by mouth.    . pantoprazole (PROTONIX) 20 MG tablet Take 1 tablet (20 mg total) by mouth daily. 30 tablet 0  . ranitidine (ZANTAC) 150 MG capsule Take 1 capsule (150 mg total) by mouth every evening. 30 capsule 0  . sucralfate (CARAFATE) 1 GM/10ML suspension Take 10 mLs (1 g total) by mouth 4 (four) times daily -  with meals and at bedtime. 420 mL 0  . triamcinolone cream (KENALOG) 0.1 % Apply 1 application topically 2 (two) times daily. Use as needed for allergic rash 30 g 1     No Known Allergies  REVIEW OF SYSTEMS: Skin:  No history of  rash.  No history of abnormal moles. Infection:  No history of hepatitis or HIV.  No history of MRSA. Neurologic:  No history of stroke.  No history of seizure.  No history of headaches. Cardiac:  No history of hypertension. No history of heart disease.  No history of seeing a cardiologist. Pulmonary:  Does not smoke cigarettes.  No asthma or bronchitis.  Seasonal allergies.  Endocrine:  No diabetes. No thyroid disease. Gastrointestinal:  See HPI. Urologic:  No history of kidney stones.  No history of bladder infections. Musculoskeletal:  No history of joint or back disease. Hematologic:  No bleeding disorder.  No history of anemia.  Not anticoagulated. Psycho-social:  The patient is oriented.   The patient has no obvious psychologic or social impairment to understanding our conversation and plan.  SOCIAL and FAMILY HISTORY: Married.  Wife Nettie Elm [phone]). He has 3 children. He works at a [employer] - for [employer].  No family history of appendicitis.  PHYSICAL EXAM: BP 124/87 (BP Location: Left Arm)   Pulse 71   Temp 99 F (37.2 C) (Oral)   Resp 16   Ht 5\' 7"  (1.702 m)   Wt 117.9 kg   SpO2 100%   BMI 40.72 kg/m   General: WN obese male who is alert and generally healthy appearing.  Skin:  Inspection and palpation - no mass or rash. Eyes:  Conjunctiva and lids unremarkable.            Pupils are equal Ears, Nose, Mouth, and Throat:  Ears and nose unremarkable            Lips and teeth are unremarable. Neck: Supple. No mass, trachea midline.  No thyroid mass. Lymph Nodes:  No supraclavicular, cervical, or inguinal nodes. Lungs: Normal respiratory effort.  Clear to auscultation and symmetric breath sounds. Heart:  Palpation of the heart is normal.            Auscultation: RRR. No murmur or rub.  Abdomen: Soft. No mass. Decreased bowel sounds.  No abdominal scars.  Tender in his right lateral mid abdomen. Rectal: Not done. Musculoskeletal:  Normal gait.             Good muscle strength and ROM  in upper and lower extremities.  Neurologic:  Grossly intact to motor and sensory function. Psychiatric: Normal judgement and insight. Behavior is normal.            Oriented to time, person, place.   DATA REVIEWED, COUNSELING AND COORDINATION OF CARE: Epic notes reviewed. Counseling and coordination of care exceeded more than 50% of the time spent with patient. Total time spent  with patient and charting: 40 minutes  Ovidio Kinavid Mikena Masoner, MD,  [facility] [place] [address]  [address]   [place], [place]    [id number] Phone:  [phone] FAX:  [phone]

## 2018-10-30 NOTE — ED Provider Notes (Signed)
[facility] EMERGENCY DEPARTMENT Provider Note   CSN: [id number] Arrival date & time: 10/30/18  1613     History   Chief Complaint Chief Complaint  Patient presents with  . Abdominal Pain    HPI Casey Sims is a 47 y.o. male with history of GERD and hyperlipidemia presents for evaluation of acute onset, progressively worsening abdominal pain beginning this morning at around 7 AM.  He reports that he had just returned from work when he began to feel severe abdominal pressure.  It is worst in the upper abdomen but radiates bilaterally.  He notes associated nausea but denies fever, chest pain, shortness of breath, diarrhea, constipation, melena, hematochezia, or urinary symptoms.  He has tried Zantac and Tylenol without relief of his symptoms.  States he has never experienced pain like this in the past.  The history is provided by the patient.    Past Medical History:  Diagnosis Date  . GERD (gastroesophageal reflux disease)   . High cholesterol     There are no active problems to display for this patient.   History reviewed. No pertinent surgical history.      Home Medications    Prior to Admission medications   Medication Sig Start Date End Date Taking? Authorizing Provider  albuterol (PROVENTIL HFA;VENTOLIN HFA) 108 (90 BASE) MCG/ACT inhaler Inhale 1 puff into the lungs every 6 (six) hours as needed for wheezing or shortness of breath.    [provider]  atorvastatin (LIPITOR) 20 MG tablet Take 20 mg by mouth daily.    [provider]  azithromycin (ZITHROMAX Z-PAK) 250 MG tablet Take 2 tabs today; then begin one tab once daily for 4 more days. (Rx void after 05/15/16) 05/07/16   Lattie HawBeese, Stephen A, MD  baclofen (LIORESAL) 10 MG tablet Take 10 mg by mouth 3 (three) times daily.    [provider]  benzonatate (TESSALON) 200 MG capsule Take 1 capsule (200 mg total) by mouth at bedtime. Take as needed for cough 05/07/16   Lattie HawBeese, Stephen A,  MD  carbamide peroxide (DEBROX) 6.5 % otic solution Place 5 drops into both ears 2 (two) times daily. 10/05/16   Lurene ShadowPhelps, Erin O, PA-C  clonazePAM (KLONOPIN) 1 MG tablet Take 1 mg by mouth at bedtime.    [provider]  dexlansoprazole (DEXILANT) 60 MG capsule Take 60 mg by mouth daily.    [provider]  dicyclomine (BENTYL) 20 MG tablet Take 1 tablet (20 mg total) by mouth 2 (two) times daily. 08/05/17   Long, Arlyss RepressJoshua G, MD  LORazepam (ATIVAN) 1 MG tablet Take 1 tablet (1 mg total) by mouth 3 (three) times daily as needed for anxiety. 05/24/15   Marisa Severintter, Olga, MD  meclizine (ANTIVERT) 25 MG tablet Take 1 tablet (25 mg total) by mouth 3 (three) times daily as needed for dizziness. 10/05/16   Lurene ShadowPhelps, Erin O, PA-C  OMEPRAZOLE PO Take by mouth.    [provider]  pantoprazole (PROTONIX) 20 MG tablet Take 1 tablet (20 mg total) by mouth daily. 08/05/17 09/04/17  Long, Arlyss RepressJoshua G, MD  ranitidine (ZANTAC) 150 MG capsule Take 1 capsule (150 mg total) by mouth every evening. 03/20/16   Geoffery Lyonselo, Douglas, MD  sucralfate (CARAFATE) 1 GM/10ML suspension Take 10 mLs (1 g total) by mouth 4 (four) times daily -  with meals and at bedtime. 08/05/17   Long, Arlyss RepressJoshua G, MD  triamcinolone cream (KENALOG) 0.1 % Apply 1 application topically 2 (two) times daily. Use  as needed for allergic rash 05/07/16   Lattie Haw, MD    Family History History reviewed. No pertinent family history.  Social History Social History   Tobacco Use  . Smoking status: Never Smoker  . Smokeless tobacco: Never Used  Substance Use Topics  . Alcohol use: Yes  . Drug use: No     Allergies   Patient has no known allergies.   Review of Systems Review of Systems  Constitutional: Negative for chills and fever.  Respiratory: Negative for shortness of breath.   Cardiovascular: Negative for chest pain.  Gastrointestinal: Positive for abdominal pain and nausea. Negative for constipation, diarrhea and vomiting.    Genitourinary: Negative for dysuria, frequency, hematuria, testicular pain and urgency.  All other systems reviewed and are negative.    Physical Exam Updated Vital Signs BP 124/87 (BP Location: Left Arm)   Pulse 71   Temp 99 F (37.2 C) (Oral)   Resp 16   Ht 5\' 7"  (1.702 m)   Wt 117.9 kg   SpO2 100%   BMI 40.72 kg/m   Physical Exam Vitals signs and nursing note reviewed.  Constitutional:      General: He is not in acute distress.    Appearance: He is well-developed.     Comments: Resting in bed, appears uncomfortable  HENT:     Head: Normocephalic and atraumatic.  Eyes:     General:        Right eye: No discharge.        Left eye: No discharge.     Conjunctiva/sclera: Conjunctivae normal.  Neck:     Vascular: No JVD.     Trachea: No tracheal deviation.  Cardiovascular:     Rate and Rhythm: Normal rate.     Heart sounds: Normal heart sounds.  Pulmonary:     Effort: Pulmonary effort is normal.     Breath sounds: Normal breath sounds.  Abdominal:     General: Abdomen is protuberant. Bowel sounds are decreased. There is no distension.     Tenderness: There is generalized abdominal tenderness and tenderness in the right lower quadrant and epigastric area. There is guarding. There is no right CVA tenderness, left CVA tenderness or rebound. Negative signs include Murphy's sign, Rovsing's sign, McBurney's sign, psoas sign and obturator sign.     Comments: Maximally tender to palpation in the epigastric region  Genitourinary:    Comments: deferred Skin:    General: Skin is warm and dry.     Findings: No erythema.  Neurological:     Mental Status: He is alert.  Psychiatric:        Behavior: Behavior normal.      ED Treatments / Results  Labs (all labs ordered are listed, but only abnormal results are displayed) Labs Reviewed  COMPREHENSIVE METABOLIC PANEL - Abnormal; Notable for the following components:      Result Value   Sodium 134 (*)    Glucose, Bld 142  (*)    Calcium 8.8 (*)    All other components within normal limits  CBC - Abnormal; Notable for the following components:   WBC 12.6 (*)    All other components within normal limits  LIPASE, BLOOD  URINALYSIS, ROUTINE W REFLEX MICROSCOPIC  SURGICAL PATHOLOGY    EKG None  Radiology Ct Abdomen Pelvis W Contrast  Result Date: 10/30/2018 CLINICAL DATA:  Generalized abdominal pain since this morning. EXAM: CT ABDOMEN AND PELVIS WITH CONTRAST TECHNIQUE: Multidetector CT imaging of the abdomen and  pelvis was performed using the standard protocol following bolus administration of intravenous contrast. CONTRAST:  ISOVUE-300 IOPAMIDOL (ISOVUE-300) INJECTION 61% COMPARISON:  CT scan 05/28/2015 FINDINGS: Lower chest: Streaky bibasilar atelectasis but no infiltrates or effusions. No worrisome pulmonary lesions. The heart is normal in size. No pericardial effusion. The distal esophagus is grossly normal. Hepatobiliary: No focal hepatic lesions or intrahepatic biliary dilatation. The gallbladder is normal. No common bile duct dilatation. Pancreas: No mass, inflammation or ductal dilatation. Spleen: Normal size.  No focal lesions. Adrenals/Urinary Tract: The adrenal glands and kidneys are unremarkable. Small renal cysts are noted. No renal lesions or hydronephrosis. The bladder is normal. Stomach/Bowel: The stomach, duodenum, small bowel and colon are grossly normal. Exam is somewhat limited by patient motion. The appendix is distended, fluid-filled and inflamed. There is also periappendiceal inflammatory changes and multiple small appendicoliths are noted. The appendix is retrocecal and located along the liver edge. Vascular/Lymphatic: The aorta is normal in caliber. No dissection. The branch vessels are patent. The major venous structures are patent. No mesenteric or retroperitoneal mass or adenopathy. Small scattered lymph nodes are noted. Reproductive: The prostate gland and seminal vesicles are normal.  Other: Small periumbilical abdominal wall hernia containing fat. No inguinal mass or inguinal hernia. Benign-appearing inguinal lymph nodes are noted bilaterally. Musculoskeletal: No significant bony findings. IMPRESSION: 1. CT findings consistent with acute un ruptured appendicitis. 2. No other acute abdominal/pelvic findings and no mass lesions or adenopathy. Electronically Signed   By: Rudie Meyer M.D.   On: 10/30/2018 20:05   Dg Abdomen Acute W/chest  Result Date: 10/30/2018 CLINICAL DATA:  Generalized abdominal pain this morning. EXAM: DG ABDOMEN ACUTE W/ 1V CHEST COMPARISON:  08/05/2017 FINDINGS: Normal heart size and mediastinal contours. Clear lungs without acute pulmonary consolidation. No edema or effusion. No pneumothorax. No free air beneath the diaphragm. No organomegaly nor radiopaque calculi. Average amount of stool retention within the colon. No bowel obstruction is seen. No acute nor suspicious osseous lesions. Mild lower lumbar facet arthropathy at L5-S1. IMPRESSION: Negative abdominal radiographs. No acute cardiopulmonary disease. Electronically Signed   By: Tollie Eth M.D.   On: 10/30/2018 18:14    Procedures Procedures (including critical care time)  Medications Ordered in ED Medications  0.9 %  sodium chloride infusion ( Intravenous MAR Hold 10/30/18 2221)  HYDROmorphone (DILAUDID) injection 0.25-0.5 mg (has no administration in time range)  ketorolac (TORADOL) 30 MG/ML injection 30 mg (has no administration in time range)  meperidine (DEMEROL) injection 6.25-12.5 mg (has no administration in time range)  promethazine (PHENERGAN) injection 6.25-12.5 mg (has no administration in time range)  lactated ringers irrigation solution (1,000 mLs Irrigation Given 10/30/18 2322)  0.9 % irrigation (POUR BTL) (1,000 mLs Irrigation Given 10/30/18 2323)  ondansetron (ZOFRAN) injection 4 mg (4 mg Intravenous Given 10/30/18 1745)  famotidine (PEPCID) IVPB 20 mg premix ( Intravenous  Stopped 10/30/18 1918)  ketorolac (TORADOL) 30 MG/ML injection 30 mg (30 mg Intravenous Given 10/30/18 1744)  iopamidol (ISOVUE-300) 61 % injection 100 mL (100 mLs Intravenous Contrast Given 10/30/18 1929)  morphine 4 MG/ML injection 4 mg (4 mg Intravenous Given 10/30/18 2022)  cefTRIAXone (ROCEPHIN) 2 g in sodium chloride 0.9 % 100 mL IVPB (0 g Intravenous Stopped 10/30/18 2118)    And  metroNIDAZOLE (FLAGYL) IVPB 500 mg (500 mg Intravenous Transfusing/Transfer 10/30/18 2148)  HYDROmorphone (DILAUDID) injection 0.5 mg (0.5 mg Intravenous Given 10/30/18 2145)     Initial Impression / Assessment and Plan / ED Course  I have  reviewed the triage vital signs and the nursing notes.  Pertinent labs & imaging results that were available during my care of the patient were reviewed by me and considered in my medical decision making (see chart for details).     Patient presenting with acute onset generalized abdominal pain since this morning around 7 AM.  He is afebrile though notes subjective fevers and chills.  Vital signs stable.  He is quite uncomfortable and on examination exhibit some guarding.  No rebound.  Lab work significant for leukocytosis of 12.6, no metabolic derangements.  No renal insufficiency.  UA does not suggest UTI or nephrolithiasis.  CT scan significant for evidence of acute appendicitis without evidence of gangrene, perforation, or abscess.  On reevaluation patient is significantly improved with Toradol.  Will give Cipro and Flagyl.  Spoke with Dr. Ezzard StandingNewman with general surgery who agrees to assume care of patient and bring him to the hospital for further evaluation and management.  [facility].   Final Clinical Impressions(s) / ED Diagnoses   Final diagnoses:  Acute appendicitis with generalized peritonitis    ED Discharge Orders    None       Bennye AlmFawze, Melquiades Kovar A, PA-C 10/31/18 Kathlen Mody0021    Kohut, Stephen, MD 10/31/18  [phone]

## 2018-10-30 NOTE — Anesthesia Preprocedure Evaluation (Addendum)
Anesthesia Evaluation  Patient identified by MRN, date of birth, ID band Patient awake    Reviewed: Allergy & Precautions, NPO status , Patient's Chart, lab work & pertinent test results  Airway Mallampati: II       Dental no notable dental hx. (+) Teeth Intact   Pulmonary neg pulmonary ROS,    Pulmonary exam normal breath sounds clear to auscultation       Cardiovascular Normal cardiovascular exam Rhythm:Regular Rate:Normal     Neuro/Psych negative neurological ROS     GI/Hepatic Neg liver ROS, GERD  Medicated,  Endo/Other  Morbid obesity  Renal/GU negative Renal ROS     Musculoskeletal negative musculoskeletal ROS (+)   Abdominal   Peds  Hematology negative hematology ROS (+)   Anesthesia Other Findings   Reproductive/Obstetrics                             Anesthesia Physical Anesthesia Plan  ASA: III  Anesthesia Plan: General   Post-op Pain Management:    Induction: Intravenous, Rapid sequence and Cricoid pressure planned  PONV Risk Score and Plan: 4 or greater and Ondansetron and Midazolam  Airway Management Planned: Oral ETT  Additional Equipment:   Intra-op Plan:   Post-operative Plan: Extubation in OR  Informed Consent: I have reviewed the patients History and Physical, chart, labs and discussed the procedure including the risks, benefits and alternatives for the proposed anesthesia with the patient or authorized representative who has indicated his/her understanding and acceptance.   Dental advisory given  Plan Discussed with: CRNA and Surgeon  Anesthesia Plan Comments:        Anesthesia Quick Evaluation

## 2018-10-31 DIAGNOSIS — K3580 Unspecified acute appendicitis: Secondary | ICD-10-CM | POA: Diagnosis present

## 2018-10-31 MED ORDER — METRONIDAZOLE IN NACL 5-0.79 MG/ML-% IV SOLN
500.0000 mg | Freq: Three times a day (TID) | INTRAVENOUS | Status: DC
  Administered 2018-10-31: 500 mg via INTRAVENOUS
  Filled 2018-10-31: qty 100

## 2018-10-31 MED ORDER — BUPIVACAINE-EPINEPHRINE 0.25% -1:200000 IJ SOLN
INTRAMUSCULAR | Status: DC | PRN
  Administered 2018-10-31: 30 mL

## 2018-10-31 MED ORDER — PANTOPRAZOLE SODIUM 20 MG PO TBEC
20.0000 mg | DELAYED_RELEASE_TABLET | Freq: Every day | ORAL | Status: DC
  Administered 2018-10-31: 20 mg via ORAL
  Filled 2018-10-31 (×2): qty 1

## 2018-10-31 MED ORDER — ENOXAPARIN SODIUM 40 MG/0.4ML ~~LOC~~ SOLN: 40.0000 mg | SUBCUTANEOUS | Status: DC

## 2018-10-31 MED ORDER — TRAMADOL HCL 50 MG PO TABS
50.0000 mg | ORAL_TABLET | Freq: Four times a day (QID) | ORAL | Status: DC | PRN
  Administered 2018-10-31: 50 mg via ORAL
  Filled 2018-10-31: qty 1

## 2018-10-31 MED ORDER — ACETAMINOPHEN 500 MG PO TABS: 500.0000 mg | ORAL_TABLET | Freq: Four times a day (QID) | ORAL | 2 refills | Status: AC | PRN

## 2018-10-31 MED ORDER — ONDANSETRON HCL 4 MG/2ML IJ SOLN
INTRAMUSCULAR | Status: DC | PRN
  Administered 2018-10-31: 4 mg via INTRAVENOUS

## 2018-10-31 MED ORDER — DEXAMETHASONE SODIUM PHOSPHATE 10 MG/ML IJ SOLN
INTRAMUSCULAR | Status: DC | PRN
  Administered 2018-10-31: 5 mg via INTRAVENOUS

## 2018-10-31 MED ORDER — SODIUM CHLORIDE 0.9 % IV SOLN
2.0000 g | INTRAVENOUS | Status: DC
  Filled 2018-10-31: qty 20

## 2018-10-31 MED ORDER — ONDANSETRON 4 MG PO TBDP: 4.0000 mg | ORAL_TABLET | Freq: Four times a day (QID) | ORAL | Status: DC | PRN

## 2018-10-31 MED ORDER — ONDANSETRON HCL 4 MG/2ML IJ SOLN: 4.0000 mg | Freq: Four times a day (QID) | INTRAMUSCULAR | Status: DC | PRN

## 2018-10-31 MED ORDER — HYDROCODONE-ACETAMINOPHEN 5-325 MG PO TABS
1.0000 | ORAL_TABLET | ORAL | Status: DC | PRN
  Administered 2018-10-31 (×2): 2 via ORAL
  Filled 2018-10-31 (×2): qty 2

## 2018-10-31 MED ORDER — IBUPROFEN 600 MG PO TABS: 600.0000 mg | ORAL_TABLET | Freq: Three times a day (TID) | ORAL | 0 refills | Status: AC | PRN

## 2018-10-31 MED ORDER — SUGAMMADEX SODIUM 200 MG/2ML IV SOLN
INTRAVENOUS | Status: DC | PRN
  Administered 2018-10-31: 300 mg via INTRAVENOUS

## 2018-10-31 MED ORDER — IBUPROFEN 200 MG PO TABS: 600.0000 mg | ORAL_TABLET | Freq: Four times a day (QID) | ORAL | Status: DC | PRN

## 2018-10-31 MED ORDER — KCL IN DEXTROSE-NACL 20-5-0.45 MEQ/L-%-% IV SOLN
INTRAVENOUS | Status: DC
  Administered 2018-10-31: 04:00:00 via INTRAVENOUS
  Filled 2018-10-31: qty 1000

## 2018-10-31 MED ORDER — OXYCODONE HCL 5 MG PO TABS: 5.0000 mg | ORAL_TABLET | ORAL | 0 refills | Status: AC | PRN

## 2018-10-31 MED ORDER — MORPHINE SULFATE (PF) 2 MG/ML IV SOLN: 1.0000 mg | INTRAVENOUS | Status: DC | PRN

## 2018-10-31 NOTE — Discharge Summary (Signed)
[facility] Discharge Summary   Patient ID: Casey DinningWilmer Sims MRN: [id number] DOB/AGE: 1971/09/10 [age] y.o.  Admit date: 10/30/2018 Discharge date: 10/31/2018  Discharge Diagnosis Patient Active Problem List   Diagnosis Date Noted  . Acute appendicitis 10/31/2018   Imaging: Ct Abdomen Pelvis W Contrast  Result Date: 10/30/2018 CLINICAL DATA:  Generalized abdominal pain since this morning. EXAM: CT ABDOMEN AND PELVIS WITH CONTRAST TECHNIQUE: Multidetector CT imaging of the abdomen and pelvis was performed using the standard protocol following bolus administration of intravenous contrast. CONTRAST:  100mL ISOVUE-300 IOPAMIDOL (ISOVUE-300) INJECTION 61% COMPARISON:  CT scan 05/28/2015 FINDINGS: Lower chest: Streaky bibasilar atelectasis but no infiltrates or effusions. No worrisome pulmonary lesions. The heart is normal in size. No pericardial effusion. The distal esophagus is grossly normal. Hepatobiliary: No focal hepatic lesions or intrahepatic biliary dilatation. The gallbladder is normal. No common bile duct dilatation. Pancreas: No mass, inflammation or ductal dilatation. Spleen: Normal size.  No focal lesions. Adrenals/Urinary Tract: The adrenal glands and kidneys are unremarkable. Small renal cysts are noted. No renal lesions or hydronephrosis. The bladder is normal. Stomach/Bowel: The stomach, duodenum, small bowel and colon are grossly normal. Exam is somewhat limited by patient motion. The appendix is distended, fluid-filled and inflamed. There is also periappendiceal inflammatory changes and multiple small appendicoliths are noted. The appendix is retrocecal and located along the liver edge. Vascular/Lymphatic: The aorta is normal in caliber. No dissection. The branch vessels are patent. The major venous structures are patent. No mesenteric or retroperitoneal mass or adenopathy. Small scattered lymph nodes are noted. Reproductive: The prostate gland and seminal vesicles are normal.  Other: Small periumbilical abdominal wall hernia containing fat. No inguinal mass or inguinal hernia. Benign-appearing inguinal lymph nodes are noted bilaterally. Musculoskeletal: No significant bony findings. IMPRESSION: 1. CT findings consistent with acute un ruptured appendicitis. 2. No other acute abdominal/pelvic findings and no mass lesions or adenopathy. Electronically Signed   By: Rudie MeyerP.  Gallerani M.D.   On: 10/30/2018 20:05   Dg Abdomen Acute W/chest  Result Date: 10/30/2018 CLINICAL DATA:  Generalized abdominal pain this morning. EXAM: DG ABDOMEN ACUTE W/ 1V CHEST COMPARISON:  08/05/2017 FINDINGS: Normal heart size and mediastinal contours. Clear lungs without acute pulmonary consolidation. No edema or effusion. No pneumothorax. No free air beneath the diaphragm. No organomegaly nor radiopaque calculi. Average amount of stool retention within the colon. No bowel obstruction is seen. No acute nor suspicious osseous lesions. Mild lower lumbar facet arthropathy at L5-S1. IMPRESSION: Negative abdominal radiographs. No acute cardiopulmonary disease. Electronically Signed   By: Tollie Ethavid  Kwon M.D.   On: 10/30/2018 18:14    Procedures Dr. Ovidio Kinavid Newman (10/31/18) - Laparoscopic Appendectomy  Hospital Course:  47 y/o M who [facility] with abdominal pain, bloating, and a CT scan consistent with acute appendicitis.  Patient was admitted and underwent procedure listed above.  Tolerated procedure well and was transferred to the floor.  Diet was advanced as tolerated.  On POD#0, the patient was voiding well, tolerating diet, ambulating well, pain well controlled, vital signs stable, incisions c/d/i and felt stable for discharge home.  Patient will follow up in our office in 2 weeks and knows to call with questions or concerns. Work note provided.   Physical Exam: General:  Alert, NAD, pleasant, comfortable Abd:  Soft, appropriately tender, mild distention, +BS,  incisions clean and dry  Allergies as of 10/31/2018   No Known Allergies     Medication List  STOP taking these medications   azithromycin 250 MG tablet Commonly known as:  ZITHROMAX Z-PAK   benzonatate 200 MG capsule Commonly known as:  TESSALON   carbamide peroxide 6.5 % OTIC solution Commonly known as:  DEBROX   dicyclomine 20 MG tablet Commonly known as:  BENTYL   LORazepam 1 MG tablet Commonly known as:  ATIVAN   meclizine 25 MG tablet Commonly known as:  ANTIVERT   pantoprazole 20 MG tablet Commonly known as:  PROTONIX   sucralfate 1 GM/10ML suspension Commonly known as:  CARAFATE   triamcinolone cream 0.1 % Commonly known as:  KENALOG     TAKE these medications   acetaminophen 500 MG tablet Commonly known as:  TYLENOL Take 1-2 tablets (500-1,000 mg total) by mouth every 6 (six) hours as needed for moderate pain.   albuterol 108 (90 Base) MCG/ACT inhaler Commonly known as:  PROVENTIL HFA;VENTOLIN HFA Inhale 1 puff into the lungs every 6 (six) hours as needed for wheezing or shortness of breath.   clonazePAM 1 MG tablet Commonly known as:  KLONOPIN Take 1 mg by mouth daily as needed for anxiety.   ibuprofen 600 MG tablet Commonly known as:  ADVIL,MOTRIN Take 1 tablet (600 mg total) by mouth every 8 (eight) hours as needed (for mild pain not relieved by other medications.).   oxyCODONE 5 MG immediate release tablet Commonly known as:  Oxy IR/ROXICODONE Take 1 tablet (5 mg total) by mouth every 4 (four) hours as needed for moderate pain or severe pain.   ranitidine 150 MG capsule Commonly known as:  ZANTAC Take 1 capsule (150 mg total) by mouth every evening. What changed:    when to take this  reasons to take this        Follow-up Information    [facility] Follow up.   Specialty:  General Surgery Why:  our office is scheduling you for post-operative follow up. please call to confirm appointment date/time.  You may also  call this number to arrange for your FMLA/disability paperwork to be filled out. Contact information: [address] [place] [id number] [phone]          Signed: Hosie Spanglelizabeth Simaan, [facility] 10/31/2018, 8:53 AM

## 2018-10-31 NOTE — Op Note (Signed)
Re:   Casey DinningWilmer Sims DOB:   04-16-1971 MRN:   [id number]                   [facility]  DATE OF PROCEDURE: 10/31/2018                              OPERATIVE REPORT  PREOPERATIVE DIAGNOSIS:  Appendicitis  POSTOPERATIVE DIAGNOSIS:  Acute purulent appendicitis.  PROCEDURE:  Laparoscopic appendectomy. Pierre.Alas[4 trocar]  SURGEON:  Sandria Balesavid H. Ezzard StandingNewman, MD  ASSISTANT:  No first assistant.  ANESTHESIA:  General endotracheal.  Anesthesiologist: Leilani AbleHatchett, Franklin, MD CRNA: Kizzie Fantasiaarver, Kelley J, CRNA  ASA:  2E  ESTIMATED BLOOD LOSS:  Minimal.  DRAINS: none   SPECIMEN:   Appendix  COUNTS CORRECT:  YES  INDICATIONS FOR PROCEDURE: Casey Sims is a 47 y.o. (DOB: 04-16-1971)  male whose primary care doctor is Patient, No Pcp Per [facility] - but he cannot remember the name of his physician) and comes to the OR for an appendectomy.   I discussed with the patient, the indications and potential complications of appendiceal surgery.  The potential complications include, but are not limited to, bleeding, open surgery, bowel resection, and the possibility of another diagnosis.  OPERATIVE NOTE:  The patient underwent a general endotracheal anesthetic as supervised by Anesthesiologist: Leilani AbleHatchett, Franklin, MD CRNA: Kizzie Fantasiaarver, Kelley J, CRNA, General, in OR room #1.  The patient was given Rocephin and Flagyl prior the beginning of the procedure and the abdomen was prepped with ChloraPrep.   A time-out was held and surgical checklist run.  An infraumbilical incision was made with sharp dissection carried down to the abdominal cavity.  An 12 mm Hasson trocar was inserted through the infraumbilical incision and into the peritoneal cavity.  A 30 degree 5 mm laparoscope was inserted through a 12 mm Hasson trocar and the Hasson trocar secured with a 0 Vicryl suture.  I placed a 12 mm trocar in the right upper quadrant, a 5 mm trocar in the right lateral abdomen, and a 5 mm torcar in left lower quadrant  and did abdominal exploration.    The right and left lobes of liver unremarkable.  Stomach was unremarkable.  The pelvic organs were unremarkable.  I saw no other intra-abdominal abnormality.  He had a lot of omental/intra peritoneal fat which limited the exploration.  The patient had purulent appendicitis with the appendix located lateral and posterior to the cecum.  I had to roll his right colon medially to get to the appendix and that is why I placed a 4th trocar..  The appendix was not ruptured.  The mesentery of the appendix was divided with a Harmonic scalpel.  I got to the base of the appendix.  I then used a blue load 45 mm Ethicon Endo-GIA stapler and fired this across the base of the appendix.  I placed the appendix in EndoCatch bag and delivered the bag through the umbilical incision.  I irrigated the abdomen with 1,000 cc of saline.  After irrigating the abdomen, I then removed the trocars, in turn.  The umbilical port fascia was closed with 0 Vicryl suture.   I closed the skin each site with a 4-0 Monocryl suture and painted the wounds with DermaBond.  I then injected a total of 30 mL of 0.25% Marcaine at the incisions.  Sponge and needle count were correct at the end of the case.  The patient was  transferred to the recovery room in good condition.  The patient tolerated the procedure well and it depends on the patient's post op clinical course as to when the patient could be discharged.   Casey Kinavid Tonique Mendonca, MD, [facility] Pager: [phone] Office phone:  [phone]

## 2018-10-31 NOTE — Transfer of Care (Signed)
Immediate Anesthesia Transfer of Care Note  Patient: Casey Sims  Procedure(s) Performed: APPENDECTOMY LAPAROSCOPIC (N/A Abdomen)  Patient Location: PACU  Anesthesia Type:General  Level of Consciousness: sedated, patient cooperative and responds to stimulation  Airway & Oxygen Therapy: Patient Spontanous Breathing and Patient connected to face mask oxygen  Post-op Assessment: Report given to RN and Post -op Vital signs reviewed and stable  Post vital signs: Reviewed and stable  Last Vitals:  Vitals Value Taken Time  BP 155/82 10/31/2018 12:41 AM  Temp    Pulse 100 10/31/2018 12:42 AM  Resp 18 10/31/2018 12:42 AM  SpO2 100 % 10/31/2018 12:42 AM  Vitals shown include unvalidated device data.  Last Pain:  Vitals:   10/30/18 2136  TempSrc:   PainSc: 9          Complications: No apparent anesthesia complications

## 2018-10-31 NOTE — Progress Notes (Signed)
Pt was discharged home today. Instructions were reviewed with patient, and questions were answered. Pt was taken to main entrance via wheelchair by NT.  

## 2018-10-31 NOTE — Discharge Instructions (Signed)
[facility] ______[facility] °LAPAROSCOPIC SURGERY: POST OP INSTRUCTIONS °Always review your discharge instruction sheet given to you by the facility where your surgery was performed. °IF YOU HAVE DISABILITY OR FAMILY LEAVE FORMS, YOU MUST BRING THEM TO THE OFFICE FOR PROCESSING.   °DO NOT GIVE THEM TO YOUR DOCTOR. ° °1. A prescription for pain medication may be given to you upon discharge.  Take your pain medication as prescribed, if needed.  If narcotic pain medicine is not needed, then you may take acetaminophen (Tylenol) or ibuprofen (Advil) as needed. °2. Take your usually prescribed medications unless otherwise directed. °3. If you need a refill on your pain medication, please contact your pharmacy.  They will contact our office to request authorization. Prescriptions will not be filled after 5pm or on week-ends. °4. You should follow a light diet the first few days after arrival home, such as soup and crackers, etc.  Be sure to include lots of fluids daily. °5. Most patients will experience some swelling and bruising in the area of the incisions.  Ice packs will help.  Swelling and bruising can take several days to resolve.  °6. It is common to experience some constipation if taking pain medication after surgery.  Increasing fluid intake and taking a stool softener (such as Colace) will usually help or prevent this problem from occurring.  A mild laxative (Milk of Magnesia or Miralax) should be taken according to package instructions if there are no bowel movements after 48 hours. °7. Unless discharge instructions indicate otherwise, you may remove your bandages 24-48 hours after surgery, and you may shower at that time.  You may have steri-strips (small skin tapes) in place directly over the incision.  These strips should be left on the skin for 7-10 days.  If your surgeon used skin glue on the incision, you may shower in 24 hours.  The glue will flake off over the next 2-3 weeks.  Any sutures or  staples will be removed at the office during your follow-up visit. °8. ACTIVITIES:  You may resume regular (light) daily activities beginning the next day--such as daily self-care, walking, climbing stairs--gradually increasing activities as tolerated.  You may have sexual intercourse when it is comfortable.  Refrain from any heavy lifting or straining until approved by your doctor. °a. You may drive when you are no longer taking prescription pain medication, you can comfortably wear a seatbelt, and you can safely maneuver your car and apply brakes. °b. RETURN TO WORK:  __________________________________________________________ °9. You should see your doctor in the office for a follow-up appointment approximately 2-3 weeks after your surgery.  Make sure that you call for this appointment within a day or two after you arrive home to insure a convenient appointment time. °10. OTHER INSTRUCTIONS: __________________________________________________________________________________________________________________________ __________________________________________________________________________________________________________________________ °WHEN TO CALL YOUR DOCTOR: °1. Fever over 101.0 °2. Inability to urinate °3. Continued bleeding from incision. °4. Increased pain, redness, or drainage from the incision. °5. Increasing abdominal pain ° °The clinic staff is available to answer your questions during regular business hours.  Please don’t hesitate to call and ask to speak to one of the nurses for clinical concerns.  If you have a medical emergency, go to the nearest emergency room or call 911.  A surgeon from [facility] is always on call at the hospital. °[address], [address], [place], [place]  [address] ? [address] [address], [place], [place]   [address] °[phone] ? [phone] ? [phone] [phone] °Web site:   [email or web address] °

## 2018-11-03 ENCOUNTER — Encounter (HOSPITAL_COMMUNITY): Payer: Self-pay | Admitting: Surgery

## 2018-11-03 NOTE — Anesthesia Postprocedure Evaluation (Signed)
Anesthesia Post Note  Patient: Casey Sims  Procedure(s) Performed: APPENDECTOMY LAPAROSCOPIC (N/A Abdomen)     Patient location during evaluation: PACU Anesthesia Type: General Level of consciousness: sedated Pain management: pain level controlled Vital Signs Assessment: post-procedure vital signs reviewed and stable Respiratory status: spontaneous breathing Cardiovascular status: stable Postop Assessment: no apparent nausea or vomiting Anesthetic complications: no    Last Vitals:  Vitals:   10/31/18 0424 10/31/18 0530  BP: 124/79 137/89  Pulse: 96 94  Resp: 16 17  Temp: 37.8 C 37.2 C  SpO2: 93% 100%    Last Pain:  Vitals:   10/31/18 1000  TempSrc:   PainSc: 3    Pain Goal: Patients Stated Pain Goal: 2 (10/31/18 1000)               Caren MacadamJohn F Makoto Sellitto Jr

## 2019-06-12 IMAGING — DX DG ABDOMEN ACUTE W/ 1V CHEST
4 series · 4 of 4 positions shown · non-contrast
Comparison: 08/05/2017

CLINICAL DATA: Generalized abdominal pain this morning.

EXAM:
DG ABDOMEN ACUTE W/ 1V CHEST

[chest pa]
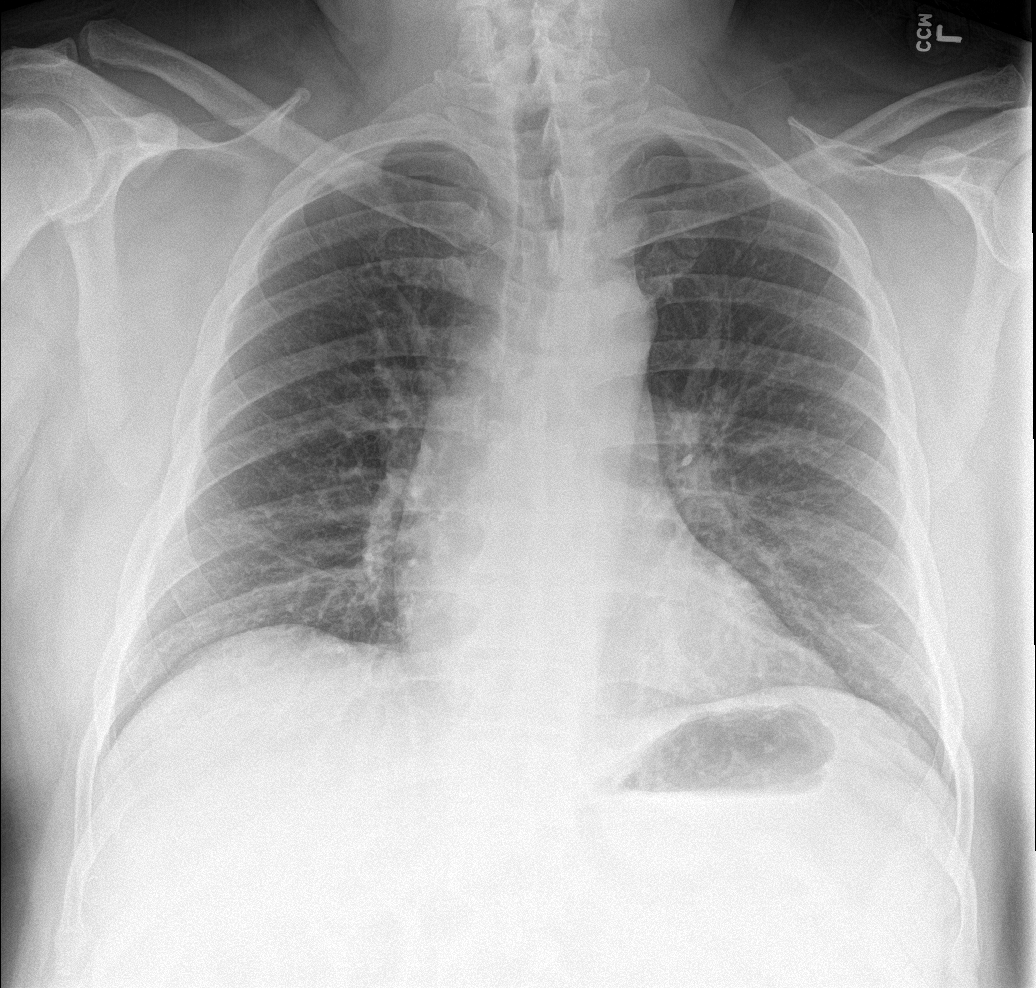

[abdomen erect]
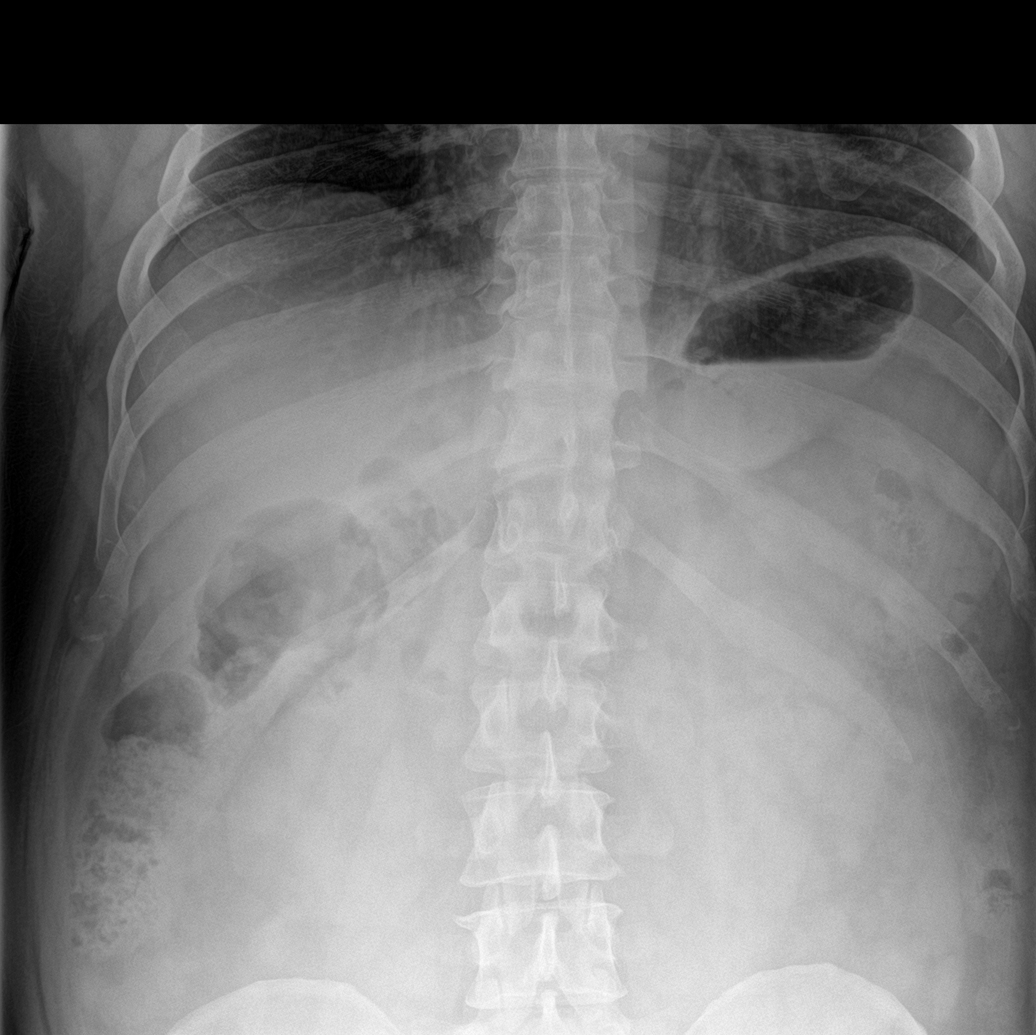

[abdomen supine (1 of 2)]
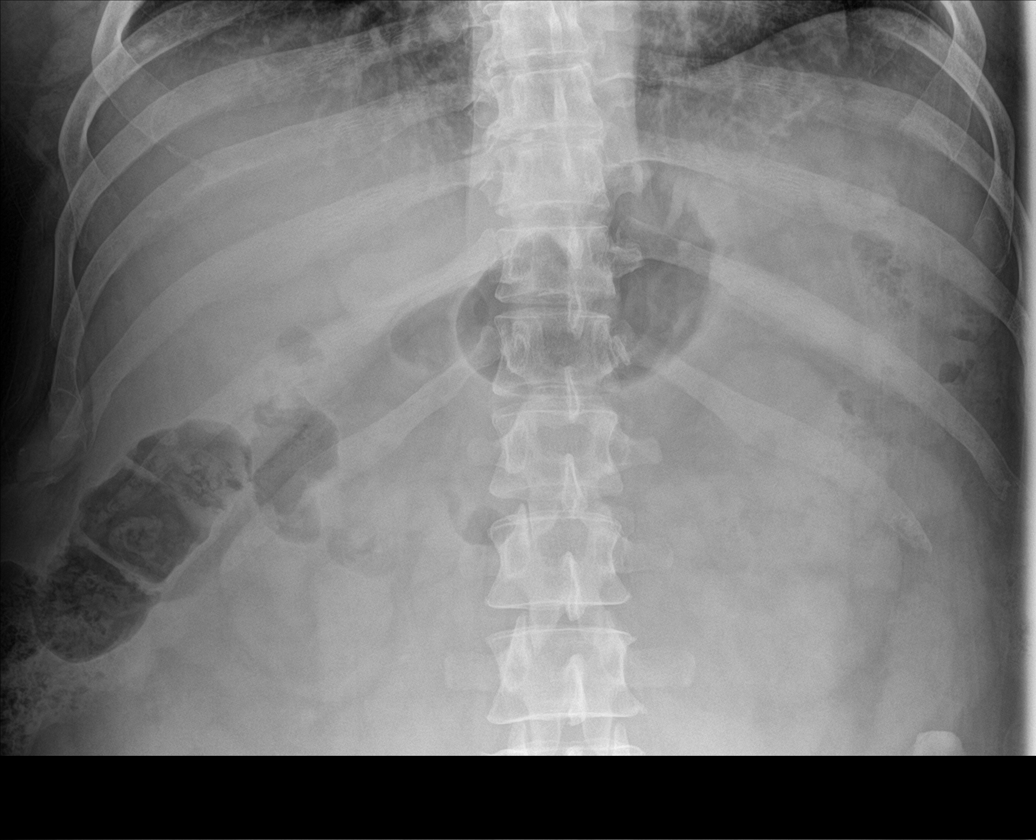

[abdomen supine (2 of 2)]
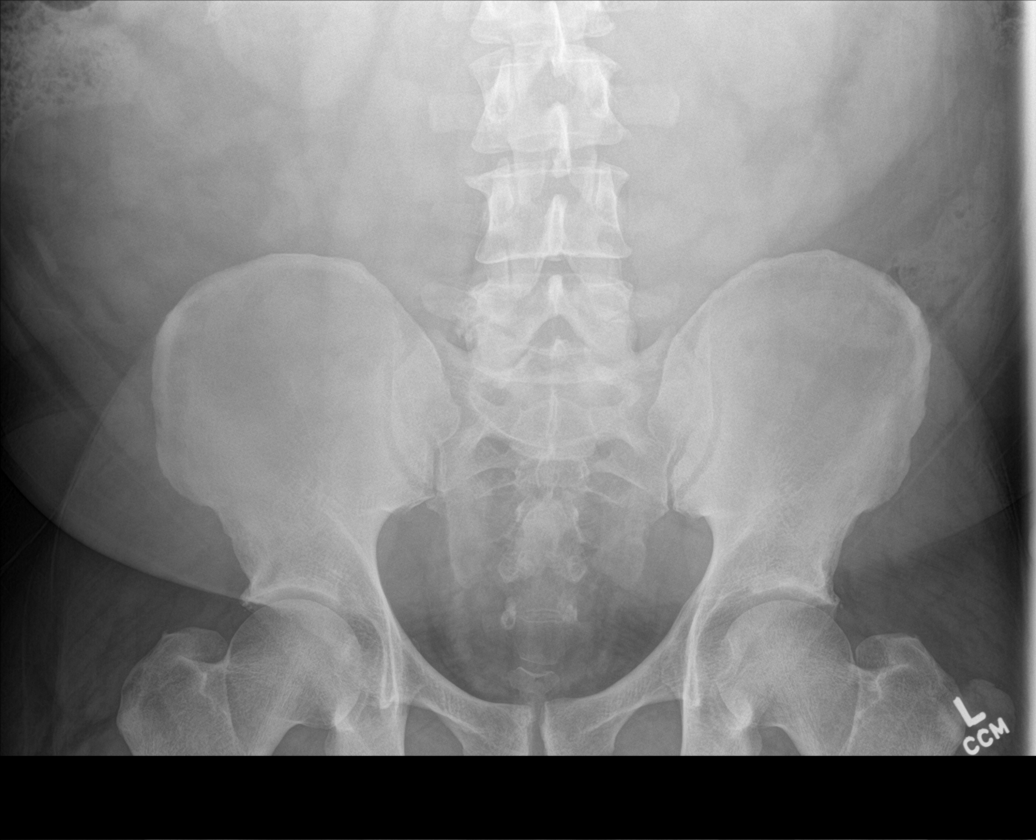

[4 of 4 positions shown; findings below may reference images not displayed]

FINDINGS: Normal heart size and mediastinal contours. Clear lungs without
acute pulmonary consolidation. No edema or effusion. No
pneumothorax. No free air beneath the diaphragm. No organomegaly nor
radiopaque calculi. Average amount of stool retention within the
colon. No bowel obstruction is seen. No acute nor suspicious osseous
lesions. Mild lower lumbar facet arthropathy at L5-S1.
IMPRESSION: Negative abdominal radiographs. No acute cardiopulmonary disease.
